# Patient Record
Sex: Female | Born: 1965 | Race: Black or African American | Hispanic: No | Marital: Single | Smoking: Current every day smoker
Health system: Southern US, Community
[De-identification: ages and names within clinical notes are randomized; demographics above are authoritative.]

## PROBLEM LIST (undated history)

## (undated) DIAGNOSIS — I10 Essential (primary) hypertension: Secondary | ICD-10-CM

## (undated) DIAGNOSIS — E119 Type 2 diabetes mellitus without complications: Secondary | ICD-10-CM

---

## 2006-11-16 ENCOUNTER — Emergency Department (HOSPITAL_COMMUNITY): Admission: EM | Admit: 2006-11-16 | Discharge: 2006-11-16 | Payer: Self-pay | Admitting: Family Medicine

## 2007-09-24 ENCOUNTER — Emergency Department (HOSPITAL_COMMUNITY): Admission: EM | Admit: 2007-09-24 | Discharge: 2007-09-24 | Payer: Self-pay | Admitting: Emergency Medicine

## 2008-05-15 ENCOUNTER — Emergency Department (HOSPITAL_COMMUNITY): Admission: EM | Admit: 2008-05-15 | Discharge: 2008-05-15 | Payer: Self-pay | Admitting: Emergency Medicine

## 2008-09-27 ENCOUNTER — Encounter (INDEPENDENT_AMBULATORY_CARE_PROVIDER_SITE_OTHER): Payer: Self-pay | Admitting: Otolaryngology

## 2008-09-27 ENCOUNTER — Inpatient Hospital Stay (HOSPITAL_COMMUNITY): Admission: EM | Admit: 2008-09-27 | Discharge: 2008-09-30 | Payer: Self-pay | Admitting: Emergency Medicine

## 2009-01-20 ENCOUNTER — Emergency Department (HOSPITAL_COMMUNITY): Admission: EM | Admit: 2009-01-20 | Discharge: 2009-01-20 | Payer: Self-pay | Admitting: Emergency Medicine

## 2009-12-06 ENCOUNTER — Emergency Department (HOSPITAL_COMMUNITY): Admission: EM | Admit: 2009-12-06 | Discharge: 2009-12-06 | Payer: Self-pay | Admitting: Emergency Medicine

## 2010-04-30 LAB — CBC
HCT: 36 % (ref 36.0–46.0)
Hemoglobin: 11 g/dL — ABNORMAL LOW (ref 12.0–15.0)
MCV: 66.4 fL — ABNORMAL LOW (ref 78.0–100.0)
Platelets: 301 10*3/uL (ref 150–400)
Platelets: 326 10*3/uL (ref 150–400)
RBC: 5.42 MIL/uL — ABNORMAL HIGH (ref 3.87–5.11)
RDW: 23.6 % — ABNORMAL HIGH (ref 11.5–15.5)
WBC: 18.3 10*3/uL — ABNORMAL HIGH (ref 4.0–10.5)
WBC: 9.6 10*3/uL (ref 4.0–10.5)

## 2010-04-30 LAB — APTT: aPTT: 27 seconds (ref 24–37)

## 2010-04-30 LAB — POCT I-STAT, CHEM 8
BUN: 5 mg/dL — ABNORMAL LOW (ref 6–23)
Calcium, Ion: 1.04 mmol/L — ABNORMAL LOW (ref 1.12–1.32)
Chloride: 105 mEq/L (ref 96–112)
HCT: 40 % (ref 36.0–46.0)
Sodium: 137 mEq/L (ref 135–145)
TCO2: 23 mmol/L (ref 0–100)

## 2010-04-30 LAB — ANAEROBIC CULTURE

## 2010-04-30 LAB — PROTIME-INR: INR: 1 (ref 0.00–1.49)

## 2010-04-30 LAB — DIFFERENTIAL
Basophils Absolute: 0 10*3/uL (ref 0.0–0.1)
Eosinophils Absolute: 0 10*3/uL (ref 0.0–0.7)
Eosinophils Relative: 0 % (ref 0–5)
Eosinophils Relative: 2 % (ref 0–5)
Lymphocytes Relative: 23 % (ref 12–46)
Lymphocytes Relative: 9 % — ABNORMAL LOW (ref 12–46)
Lymphs Abs: 1.6 10*3/uL (ref 0.7–4.0)
Monocytes Relative: 4 % (ref 3–12)
Monocytes Relative: 7 % (ref 3–12)
Neutrophils Relative %: 68 % (ref 43–77)

## 2010-04-30 LAB — RAPID STREP SCREEN (MED CTR MEBANE ONLY): Streptococcus, Group A Screen (Direct): NEGATIVE

## 2010-04-30 LAB — CULTURE, ROUTINE-ABSCESS

## 2010-05-05 LAB — DIFFERENTIAL
Basophils Relative: 0 % (ref 0–1)
Lymphocytes Relative: 13 % (ref 12–46)
Lymphs Abs: 1.5 10*3/uL (ref 0.7–4.0)
Monocytes Relative: 4 % (ref 3–12)
Neutro Abs: 9.4 10*3/uL — ABNORMAL HIGH (ref 1.7–7.7)
Neutrophils Relative %: 82 % — ABNORMAL HIGH (ref 43–77)

## 2010-05-05 LAB — URINALYSIS, ROUTINE W REFLEX MICROSCOPIC
Specific Gravity, Urine: 1.018 (ref 1.005–1.030)
Urobilinogen, UA: 0.2 mg/dL (ref 0.0–1.0)
pH: 5.5 (ref 5.0–8.0)

## 2010-05-05 LAB — POCT I-STAT, CHEM 8
Calcium, Ion: 1.15 mmol/L (ref 1.12–1.32)
Chloride: 106 mEq/L (ref 96–112)
HCT: 32 % — ABNORMAL LOW (ref 36.0–46.0)
TCO2: 27 mmol/L (ref 0–100)

## 2010-05-05 LAB — CBC
HCT: 31.3 % — ABNORMAL LOW (ref 36.0–46.0)
MCHC: 32.3 g/dL (ref 30.0–36.0)
Platelets: 252 10*3/uL (ref 150–400)
RBC: 3.85 MIL/uL — ABNORMAL LOW (ref 3.87–5.11)
WBC: 11.5 10*3/uL — ABNORMAL HIGH (ref 4.0–10.5)
WBC: 14.4 10*3/uL — ABNORMAL HIGH (ref 4.0–10.5)

## 2010-05-05 LAB — URINE MICROSCOPIC-ADD ON

## 2010-05-05 LAB — POCT PREGNANCY, URINE

## 2010-06-17 ENCOUNTER — Emergency Department (HOSPITAL_COMMUNITY)
Admission: EM | Admit: 2010-06-17 | Discharge: 2010-06-17 | Disposition: A | Discharge status: Home / Self Care | Payer: Medicaid Other | Attending: Emergency Medicine | Admitting: Emergency Medicine

## 2010-06-17 DIAGNOSIS — L2989 Other pruritus: Secondary | ICD-10-CM | POA: Insufficient documentation

## 2010-06-17 DIAGNOSIS — I10 Essential (primary) hypertension: Secondary | ICD-10-CM | POA: Insufficient documentation

## 2010-06-17 DIAGNOSIS — R21 Rash and other nonspecific skin eruption: Secondary | ICD-10-CM | POA: Insufficient documentation

## 2010-06-17 DIAGNOSIS — Z79899 Other long term (current) drug therapy: Secondary | ICD-10-CM | POA: Insufficient documentation

## 2010-06-17 DIAGNOSIS — L298 Other pruritus: Secondary | ICD-10-CM | POA: Insufficient documentation

## 2010-06-17 DIAGNOSIS — Z7982 Long term (current) use of aspirin: Secondary | ICD-10-CM | POA: Insufficient documentation

## 2010-08-17 ENCOUNTER — Emergency Department (HOSPITAL_COMMUNITY): Payer: Medicaid Other

## 2010-08-17 ENCOUNTER — Ambulatory Visit (HOSPITAL_COMMUNITY)
Admission: EM | Admit: 2010-08-17 | Discharge: 2010-08-18 | Disposition: A | Discharge status: Home / Self Care | Payer: Medicaid Other | Attending: Cardiology | Admitting: Cardiology

## 2010-08-17 DIAGNOSIS — I2510 Atherosclerotic heart disease of native coronary artery without angina pectoris: Secondary | ICD-10-CM | POA: Insufficient documentation

## 2010-08-17 DIAGNOSIS — Z01812 Encounter for preprocedural laboratory examination: Secondary | ICD-10-CM | POA: Insufficient documentation

## 2010-08-17 DIAGNOSIS — Z01818 Encounter for other preprocedural examination: Secondary | ICD-10-CM | POA: Insufficient documentation

## 2010-08-17 DIAGNOSIS — I10 Essential (primary) hypertension: Secondary | ICD-10-CM | POA: Insufficient documentation

## 2010-08-17 DIAGNOSIS — R079 Chest pain, unspecified: Principal | ICD-10-CM | POA: Insufficient documentation

## 2010-08-17 LAB — BASIC METABOLIC PANEL
BUN: 10 mg/dL (ref 6–23)
CO2: 23 mEq/L (ref 19–32)
Chloride: 107 mEq/L (ref 96–112)
Creatinine, Ser: 0.6 mg/dL (ref 0.50–1.10)
GFR calc Af Amer: >60 mL/min (ref >60)
Potassium: 3.8 mEq/L (ref 3.5–5.1)

## 2010-08-17 LAB — LIPID PANEL
HDL: 42 mg/dL (ref >39)
LDL Cholesterol: 107 mg/dL — ABNORMAL HIGH (ref 0–99)
Total CHOL/HDL Ratio: 3.9 RATIO

## 2010-08-17 LAB — CBC
HCT: 35.1 % — ABNORMAL LOW (ref 36.0–46.0)
MCV: 71.6 fL — ABNORMAL LOW (ref 78.0–100.0)
RBC: 4.9 MIL/uL (ref 3.87–5.11)
WBC: 14.1 10*3/uL — ABNORMAL HIGH (ref 4.0–10.5)

## 2010-08-17 LAB — CK TOTAL AND CKMB (NOT AT ARMC)
Relative Index: 1.2 (ref 0.0–2.5)
Relative Index: 1.3 (ref 0.0–2.5)
Total CK: 126 U/L (ref 7–177)

## 2010-08-17 LAB — DIFFERENTIAL
Lymphocytes Relative: 33 % (ref 12–46)
Lymphs Abs: 4.7 10*3/uL — ABNORMAL HIGH (ref 0.7–4.0)
Neutrophils Relative %: 60 % (ref 43–77)

## 2010-08-17 LAB — APTT: aPTT: 31 seconds (ref 24–37)

## 2010-08-17 LAB — TROPONIN I
Troponin I: <0.3 ng/mL (ref <0.30)
Troponin I: <0.3 ng/mL (ref <0.30)

## 2010-08-17 LAB — POCT PREGNANCY, URINE: Preg Test, Ur: NEGATIVE

## 2010-08-17 MED ORDER — IOHEXOL 300 MG/ML  SOLN
100.0000 mL | Freq: Once | INTRAMUSCULAR | Status: AC | PRN
  Administered 2010-08-17: 100 mL via INTRAVENOUS

## 2010-08-18 LAB — CBC
Platelets: 317 10*3/uL (ref 150–400)
RBC: 4.78 MIL/uL (ref 3.87–5.11)
RDW: 19.7 % — ABNORMAL HIGH (ref 11.5–15.5)
WBC: 12.3 10*3/uL — ABNORMAL HIGH (ref 4.0–10.5)

## 2010-08-18 LAB — COMPREHENSIVE METABOLIC PANEL
ALT: 12 U/L (ref 0–35)
AST: 16 U/L (ref 0–37)
Albumin: 3.2 g/dL — ABNORMAL LOW (ref 3.5–5.2)
Alkaline Phosphatase: 91 U/L (ref 39–117)
CO2: 24 mEq/L (ref 19–32)
Chloride: 105 mEq/L (ref 96–112)
GFR calc non Af Amer: >60 mL/min (ref >60)
Potassium: 3.8 mEq/L (ref 3.5–5.1)
Sodium: 139 mEq/L (ref 135–145)
Total Bilirubin: 0.1 mg/dL — ABNORMAL LOW (ref 0.3–1.2)

## 2010-08-18 NOTE — Cardiovascular Report (Signed)
NAMESHERIDYN, Lynn Compton NO.:  [id number]  MEDICAL RECORD NO.:  [id number]  LOCATION:  6531                         FACILITY:  [facility]  PHYSICIAN:  Verne Carrow, MDDATE OF BIRTH:  December 21, 1965  DATE OF PROCEDURE:  08/17/2010 DATE OF DISCHARGE:                           CARDIAC CATHETERIZATION   PRIMARY CARDIOLOGIST:  Dr. Peter [place].  PROCEDURES PERFORMED: 1. Left heart catheterization 2. Selective coronary angiography. 3. Left ventricular angiogram.  OPERATOR:  Verne Carrow, MD  INDICATIONS:  This is a 45 year old African American female with severely uncontrolled hypertension who presented to the emergency department with hypertensive urgency and complaints of chest pain. Stress echocardiogram showed possible ischemia in the anterior wall as well as a hypertensive blood pressure response to exercise.  The patient had a systolic blood pressure of 240 with exercise.  Diagnostic catheterization was arranged by Dr. Peter [place].  DETAILS OF PROCEDURE:  The patient was brought to the main cardiac catheterization laboratory after signing informed consent for the procedure.  Initially an Freida Busman test was performed on the right wrist and was positive.  We then prepped the right wrist in a sterile fashion. Lidocaine 1% was used for local anesthesia.  After multiple attempts, we were unable to gain access into the right radial artery.  We then proceeded with obtaining access into the right femoral artery.  This was with a 5-French sheath.  Lidocaine 1% was used for local anesthesia.  We then performed a left heart catheterization with standard diagnostic catheters.  A pigtail catheter was used to perform a left ventricular angiogram.  The patient tolerated the procedure well.  There were no immediate complications.  The patient was taken to the recovery area in stable condition.  HEMODYNAMIC FINDINGS:  Central aortic pressure 189/86.  Left  ventricular pressure 192/10/18.  ANGIOGRAPHIC FINDINGS: 1. The left main coronary artery had no evidence of disease. 2. The left anterior descending was a large vessel that coursed to the     apex and gave off a large diagonal branch.  There was mild plaque     in the proximal and mid vessel.  The diagonal branch was large and     had no evidence of disease. 3. The circumflex artery gave off three obtuse marginal branches that     were all moderate in size without any disease. 4. The right coronary artery was a large dominant vessel with mild     luminal irregularities in the midportion. 5. Left ventricular angiogram was performed in the RAO projection and     showed low normal left ventricular systolic function with ejection     fraction of 50%.  IMPRESSION: 1. Mild nonobstructive coronary artery disease. 2. Low normal left ventricular systolic function. 3. Uncontrolled hypertension.  RECOMMENDATIONS:  At this time I would recommend medical management of the patient's mild nonobstructive coronary artery disease and tight control of her hypertension.  We will continue her beta-blocker and lisinopril.  She will be watched closely tonight.  Probable discharge tomorrow.  I would also start the patient on a statin medication.     Verne Carrow, MD     CM/MEDQ  D:  08/17/2010  T:  08/18/2010  Job:  [id number]  cc:   Peter M. [place], M.D.  Electronically Signed by Verne Carrow MD on 08/18/2010 01:48:12 PM

## 2010-08-18 NOTE — Discharge Summary (Addendum)
NAMETRENIYA, LOBB               ACCOUNT NO.:  [id number]  MEDICAL RECORD NO.:  [id number]  LOCATION:  6531                         FACILITY:  [facility]  PHYSICIAN:  Verne Carrow, MDDATE OF BIRTH:  December 27, 1965  DATE OF ADMISSION:  08/17/2010 DATE OF DISCHARGE:  08/18/2010                              DISCHARGE SUMMARY   DISCHARGE DIAGNOSES: 1. Chest pain.     a.     Ruled out for myocardial infarction.     b.     Cardiac catheterization on August 17, 2010, demonstrating mild      nonobstructive coronary artery disease, for medical therapy.     c.     Ejection fraction was 50% by catheterization on August 17, 2010. 2. Uncontrolled hypertension. 3. Anemia.  HOSPITAL COURSE:  Ms. Blackston is a 45 year old female with no prior cardiac history, who has a longstanding history of hypertension.  She presented to the ER with complaints of chest pain, localized with a substernal discomfort with no associated nausea, vomiting, shortness of breath, or diaphoresis.  Cardiac enzymes and EKG were normal.  CT angiogram of the chest was negative for PE.  She had a stress echocardiogram yesterday demonstrating poor exercise tolerance with marked hypertensive blood pressures as well as with evidence of anterior septal hypokinesis on stress imaging consistent with ischemia. Therefore, she was seen in consultation by Cardiology and remained in the hospital for further rule out.  Next set of cardiac enzymes were negative.  She was markedly hypertensive, so blood pressure medications were initiated in the form of lisinopril, hydrochlorothiazide, as well as beta-blockade.  She underwent cardiac catheterization later that day demonstrating mild nonobstructive CAD with low-normal LV function.  EF was 50%.  The patient was started on statin medication as part of medical therapy.  On day of discharge, she was feeling better.  Blood pressure still in 159/81.  Dr. Clifton James elected to increase  her medications with the addition of beta-blocker at discharge, as well as lisinopril/hydrochlorothiazide 40/25 mg p.o. daily.  Dr. Clifton James seen and examined her today and felt she is stable for discharge.  DISCHARGE LABORATORY DATA:  WBC 12.3, hemoglobin 11.5, hematocrit 34.1, platelet count 317. Sodium 139, potassium 3.8, chloride 105, CO2 of 24, glucose 108, BUN 9, creatinine 0.53.  LFTs were normal on August 18, 2010, with the exception of decreased total bilirubin 0.1 and albumin 3.2. Cardiac enzymes negative x3.  Total cholesterol 163, triglycerides 72, LDL 107.  STUDIES: 1. Cardiac catheterization on August 17, 2010, please see full report     for details for details. 2. CT angiogram of the chest on August 17, 2010, showed no pulmonary     embolism identified.  Cardiomegaly.  Mild areas of ground-glass     opacification, mosaic attenuation, likely secondary to atelectasis     or air trapping, mild pulmonary edema could have similar     appearance. 3. Chest x-ray on August 17, 2010, shows stable cardiomegaly without     acute process identified.  DISCHARGE MEDICATIONS: 1. Coreg 6.25 mg b.i.d. 2. Nitroglycerin sublingual 0.4 mg every 5 minutes as needed up to 3     doses for  chest pain. 3. Prilosec 80 mg nightly. 4. Lisinopril/hydrochlorothiazide 20/12.5 mg 2 tablets daily. 5. Aspirin 81 mg daily.  DISPOSITION:  Ms. Shimabukuro will be discharged in stable condition to home. She is to follow a low-sodium, heart-healthy diet, and call or return if she notices any pain, swelling, bleeding, or pus at cath site.  She will follow up at Dr. Elvis Coil office in 2-3 weeks and our office will call her with that appointment.  She is also instructed to seek an appointment with her primary care doctor within the week to discuss workup of her anemia that was noted this admission as well.  DURATION OF DISCHARGE ENCOUNTER:  Greater than 30 minutes including physician and PA time.     Ronie Spies,  P.A.C.   ______________________________ Verne Carrow, MD    DD/MEDQ  D:  08/18/2010  T:  08/18/2010  Job:  [id number]  cc:   Peter M. [place], M.D.  Electronically Signed by Verne Carrow MD on 08/18/2010 01:48:14 PM Electronically Signed by Ronie Spies  on 08/19/2010 06:47:50 AM MedRecNo: [id number] [facility], Account: [id number], DocSeq: [id number] Aspirin 81mg  is also part of her discharge medications. Electronically Signed by Ronie Spies  on 08/19/2010 06:47:46 AM

## 2010-08-20 NOTE — H&P (Signed)
Lynn Compton, Lynn Compton NO.:  [id number]  MEDICAL RECORD NO.:  [id number]  LOCATION:  6531                         FACILITY:  [facility]  PHYSICIAN:  Willman Cuny M. [place], M.D.  DATE OF BIRTH:  09/20/1965  DATE OF ADMISSION:  08/17/2010 DATE OF DISCHARGE:                             HISTORY & PHYSICAL   HISTORY OF PRESENT ILLNESS:  Ms. Preston is a 45 year old African American female who has a long history of hypertension.  She presented to the emergency department last night with complaint of chest pain.  The pain was first noted when she was carrying groceries last night.  It was described as a localized substernal chest pain.  She had no radiation. She denied any nausea, vomiting, shortness of breath, diaphoresis.  The pain continued until approximately 1 a.m. where she was uncomfortable enough that she presented to the emergency room.  She is currently pain free.  She does have cardiac risk factors including hypertension, family history of coronary artery disease, and tobacco use.  Her lipid status is unknown.  Evaluation in the emergency department included a cardiac enzymes and ECG which were normal.  CT of the chest was negative.  She had a stress echo earlier today which showed poor exercise tolerance with marked hypertensive blood pressure response.  There was evidence of anteroseptal hypokinesis on stress imaging consistent with ischemia.  PAST MEDICAL HISTORY:  Hypertension.  ALLERGIES:  No known allergies.  Surgeries include C-section and a lymph node excision in her left neck.  SOCIAL HISTORY:  She is unemployed.  She is married with 2 children. She smokes 5-6 cigarettes per day.  She drinks occasional alcohol on the weekends.  FAMILY HISTORY:  Mother died at age 37 with diabetes.  Father died at age 18, had a history of alcohol use and a heart attack.  One brother has diabetes, one brother has history of stroke.  REVIEW OF SYSTEMS:  She denies any  diarrhea.  She has had no history of kidney dysfunction.  She has had no swelling or orthopnea.  All other systems are reviewed and are negative.  She has no prior history of stroke or TIA.  PHYSICAL EXAMINATION:  GENERAL:  She is an obese black female in no apparent distress. VITAL SIGNS:  Blood pressure is 192/90, pulse is 70 and regular, she is afebrile. HEENT:  She is normocephalic, atraumatic.  Pupils are equal, round, and reactive to light and accommodation.  Extraocular movements are full. Oropharynx is clear. NECK:  Supple without JVD, adenopathy, thyromegaly, or bruits. LUNGS:  Clear. CARDIAC:  Regular rate and rhythm without gallop, murmur, or click. ABDOMEN:  Soft and nontender.  There are no masses or bruits. EXTREMITIES:  Without edema.  Pulses are 2+ and symmetric. SKIN:  Warm and dry. NEUROLOGIC:  She is alert and oriented x3.  Cranial nerves II through XII are intact.  She has no focal findings.  Gait is normal.  LABORATORY DATA:  ECG demonstrates normal sinus rhythm with a rate of 71 beats per minute and a normal ECG.  Urine pregnancy screen is negative. Chest x-ray shows cardiomegaly with no active disease.  Point-of-care cardiac enzymes are negative x2.  CT of the chest with contrast was negative for pulmonary embolus or other pathology other than mild bilateral hilar adenopathy.  White count is 14,100, hemoglobin 11.1, hematocrit 35.1, platelets 322,000.  Sodium is 141, potassium 3.8, chloride 107, CO2 of 23, BUN 10, creatinine 0.6, glucose of 117.  Stress echo showed poor exercise tolerance.  The patient was able to exercise only 5 minutes.  She did have an adequate heart rate response.  Her blood pressure response was hypertensive with a systolic rating up to 242.  She did have evidence of mid-to-distal anterior and septal hypokinesis with stress.  Plan on TURP.  IMPRESSION: 1. Chest pain, atypical, but with positive stress echocardiogram     suggesting  evidence of ischemia in the left anterior descending     artery territory. 2. Hypertension, poorly controlled. 3. Obesity. 4. Tobacco use. 5. Mild anemia.  PLAN:  We will check her lipid status.  We will resume antihypertensive therapy with lisinopril/hydrochlorothiazide and beta-blocker therapy. She will be given aspirin.  We will arrange for a diagnostic cardiac catheterization later today.  This procedure and its potential risks were explained in detail to the patient.          ______________________________ Tyjon Bowen M. [place], M.D.     PMJ/MEDQ  D:  08/17/2010  T:  08/18/2010  Job:  [id number]  Electronically Signed by Daijanae Rafalski [place] M.D. on 08/20/2010 08:50:54 AM

## 2010-10-26 ENCOUNTER — Other Ambulatory Visit: Payer: Self-pay | Admitting: Internal Medicine

## 2010-10-26 DIAGNOSIS — Z1231 Encounter for screening mammogram for malignant neoplasm of breast: Secondary | ICD-10-CM

## 2010-11-03 LAB — POCT PREGNANCY, URINE: Operator id: 239701

## 2010-11-03 LAB — POCT URINALYSIS DIP (DEVICE)
Bilirubin Urine: NEGATIVE
Hgb urine dipstick: NEGATIVE
Ketones, ur: NEGATIVE
Specific Gravity, Urine: 1.02
pH: 5.5

## 2010-11-10 ENCOUNTER — Ambulatory Visit
Admission: RE | Admit: 2010-11-10 | Discharge: 2010-11-10 | Disposition: A | Discharge status: Home / Self Care | Payer: Medicaid Other | Source: Ambulatory Visit | Attending: Internal Medicine | Admitting: Internal Medicine

## 2010-11-10 DIAGNOSIS — Z1231 Encounter for screening mammogram for malignant neoplasm of breast: Secondary | ICD-10-CM

## 2010-11-11 ENCOUNTER — Other Ambulatory Visit: Payer: Self-pay | Admitting: Internal Medicine

## 2010-11-11 DIAGNOSIS — R928 Other abnormal and inconclusive findings on diagnostic imaging of breast: Secondary | ICD-10-CM

## 2010-11-25 ENCOUNTER — Ambulatory Visit
Admission: RE | Admit: 2010-11-25 | Discharge: 2010-11-25 | Disposition: A | Discharge status: Home / Self Care | Payer: Medicaid Other | Source: Ambulatory Visit | Attending: Internal Medicine | Admitting: Internal Medicine

## 2010-11-25 DIAGNOSIS — R928 Other abnormal and inconclusive findings on diagnostic imaging of breast: Secondary | ICD-10-CM

## 2012-07-03 ENCOUNTER — Encounter (HOSPITAL_COMMUNITY): Payer: Self-pay | Admitting: Emergency Medicine

## 2012-07-03 ENCOUNTER — Emergency Department (HOSPITAL_COMMUNITY): Payer: No Typology Code available for payment source

## 2012-07-03 ENCOUNTER — Emergency Department (HOSPITAL_COMMUNITY)
Admission: EM | Admit: 2012-07-03 | Discharge: 2012-07-03 | Disposition: A | Discharge status: Home / Self Care | Payer: No Typology Code available for payment source | Attending: Emergency Medicine | Admitting: Emergency Medicine

## 2012-07-03 DIAGNOSIS — M65849 Other synovitis and tenosynovitis, unspecified hand: Secondary | ICD-10-CM | POA: Insufficient documentation

## 2012-07-03 DIAGNOSIS — M65839 Other synovitis and tenosynovitis, unspecified forearm: Secondary | ICD-10-CM | POA: Insufficient documentation

## 2012-07-03 DIAGNOSIS — F17200 Nicotine dependence, unspecified, uncomplicated: Secondary | ICD-10-CM | POA: Insufficient documentation

## 2012-07-03 DIAGNOSIS — Z7982 Long term (current) use of aspirin: Secondary | ICD-10-CM | POA: Insufficient documentation

## 2012-07-03 DIAGNOSIS — I10 Essential (primary) hypertension: Secondary | ICD-10-CM | POA: Insufficient documentation

## 2012-07-03 DIAGNOSIS — Z79899 Other long term (current) drug therapy: Secondary | ICD-10-CM | POA: Insufficient documentation

## 2012-07-03 DIAGNOSIS — M779 Enthesopathy, unspecified: Secondary | ICD-10-CM

## 2012-07-03 HISTORY — DX: Essential (primary) hypertension: I10

## 2012-07-03 NOTE — ED Notes (Signed)
Applied lt. Splint .  Verbal instructions given to pt. On care of splint. Pt. Verbalized understanding

## 2012-07-03 NOTE — ED Provider Notes (Signed)
History     CSN: [id number]  Arrival date & time 07/03/12  0830   First MD Initiated Contact with Patient 07/03/12 [phone]      Chief Complaint  Patient presents with  . Wrist Pain    (Consider location/radiation/quality/duration/timing/severity/associated sxs/prior treatment) HPI Comments: Patient is a 47 year old female who presents for swelling of her left upper extremity, slightly proximal to her left wrist x3 days. Patient states that she noticed the swelling 3 days ago and has been unchanged since onset. Patient denies any aggravating or alleviating factors and denies injury or trauma to her left arm or wrist. Patient denies any pain, decreased range of motion, numbness, redness or pallor. Patient also denies fever. 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  Patient is a 47 y.o. female presenting with wrist pain. The history is provided by the patient. No language interpreter was used.  Wrist Pain This is a new problem. Episode onset: 3 days. The problem occurs constantly. The problem has been unchanged. Pertinent negatives include no arthralgias, chills, fever, myalgias, numbness, rash or weakness. Nothing aggravates the symptoms. She has tried nothing for the symptoms.    Past Medical History  Diagnosis Date  . Hypertension     History reviewed. No pertinent past surgical history.  History reviewed. No pertinent family history.  History  Substance Use Topics  . Smoking status: Current Every Day Smoker  . Smokeless tobacco: Not on file  . Alcohol Use: Yes     Comment: occ    OB History   Grav Para Term Preterm Abortions TAB SAB Ect Mult Living                  Review of Systems  Constitutional: Negative for fever and chills.  Respiratory: Negative for shortness of breath.   Musculoskeletal: Negative for myalgias and arthralgias.       Swelling of LUE proximal to L wrist  Skin: Negative for color change, pallor, rash and wound.   Neurological: Negative for weakness and numbness.  All other systems reviewed and are negative.    Allergies  Review of patient's allergies indicates no known allergies.  Home Medications   Current Outpatient Rx  Name  Route  Sig  Dispense  Refill  . acetaminophen-codeine (TYLENOL #3) 300-30 MG per tablet   Oral   Take 1 tablet by mouth every 6 (six) hours as needed for pain.         Marland Kitchen aspirin EC 81 MG tablet   Oral   Take 81 mg by mouth daily.         . carvedilol (COREG) 12.5 MG tablet   Oral   Take 12.5 mg by mouth daily.         Marland Kitchen lisinopril-hydrochlorothiazide (PRINZIDE,ZESTORETIC) 20-12.5 MG per tablet   Oral   Take 2 tablets by mouth daily.           BP 139/70  Pulse 73  Temp(Src) 98 F (36.7 C) (Oral)  Resp 18  SpO2 97%  LMP 06/21/2012  Physical Exam  Nursing note and vitals reviewed. Constitutional: She is oriented to person, place, and time. She appears well-developed and well-nourished. No distress.  HENT:  Head: Normocephalic and atraumatic.  Eyes: Conjunctivae and EOM are normal. No scleral icterus.  Neck: Normal range of motion.  Cardiovascular: Normal rate, regular rhythm and intact distal pulses.   Distal radial pulses 2+ bilaterally. Capillary refill normal.  Pulmonary/Chest: Effort normal. No  respiratory distress.  Musculoskeletal: Normal range of motion.  Swelling on dorsal aspect of LUE, approximately 4 cm in diameter proximal to left wrist; no erythema, rashes, or TTP. Patient has equal grip strength b/l and normal ROM of LUE and L wrist joint. No sensory or motor deficits appreciated.  Neurological: She is alert and oriented to person, place, and time. She exhibits normal muscle tone.  Skin: Skin is warm and dry. No rash noted. She is not diaphoretic. No erythema. No pallor.  Psychiatric: She has a normal mood and affect. Her behavior is normal.    ED Course  Procedures (including critical care time)  Labs Reviewed - No data to  display Dg Wrist Complete Left  07/03/2012   *RADIOLOGY REPORT*  Clinical Data: Distal forearm swelling without evidence of injury  LEFT WRIST - COMPLETE 3+ VIEW  Comparison: None.  Findings: Frontal, oblique, lateral, and ulnar deviation scaphoid images were obtained.  No fracture or dislocation.  There is prominence of the pronator quadratus fat pad, a finding consistent with soft tissue edema, possibly with effusion in this area.  There is no appreciable arthropathic change.  No erosions.  IMPRESSION: Prominence of the pronator quadratus fat pad of uncertain etiology. No fracture or dislocation.  No appreciable arthropathy.   Original Report Authenticated By: Bretta Bang, M.D.    1. Tendonitis      MDM  Uncomplicated soft tissue swelling of left arm proximal to L wrist, most consistent with tendonitis 2/2 overuse - likely work related. Xray significant for prominence of pronator quadratus fat pad without evidence of fracture. Patient neurovascularly intact. No redness, TTP, or heat to touch to suspect infection. No limited ROM on physical exam. No swelling of L hand or pitting edema to suspect vascular insufficiency. Patient put in wrist brace in ED and given RICE instruction. Appropriate for d/c with PCP follow up to ensure resolution of symptoms. Indications for ED return discussed. Patient states comfort and understanding with this discharge plan with no unaddressed concerns.        Antony Madura, PA-C 07/03/12 [phone]

## 2012-07-03 NOTE — ED Notes (Signed)
Pt c/o left wrist pain x 3 days with some swelling; pt denies obvious injury

## 2012-07-03 NOTE — ED Provider Notes (Signed)
Medical screening examination/treatment/procedure(s) were performed by non-physician practitioner and as supervising physician I was immediately available for consultation/collaboration.   Henessy Rohrer III, MD 07/03/12 [id number] 

## 2013-08-21 ENCOUNTER — Other Ambulatory Visit: Payer: Self-pay | Admitting: Internal Medicine

## 2013-08-21 DIAGNOSIS — N63 Unspecified lump in unspecified breast: Secondary | ICD-10-CM

## 2014-02-19 ENCOUNTER — Encounter (HOSPITAL_COMMUNITY): Payer: Self-pay

## 2014-02-19 ENCOUNTER — Emergency Department (HOSPITAL_COMMUNITY)
Admission: EM | Admit: 2014-02-19 | Discharge: 2014-02-19 | Disposition: A | Discharge status: Home / Self Care | Payer: No Typology Code available for payment source | Attending: Emergency Medicine | Admitting: Emergency Medicine

## 2014-02-19 DIAGNOSIS — Z7982 Long term (current) use of aspirin: Secondary | ICD-10-CM | POA: Insufficient documentation

## 2014-02-19 DIAGNOSIS — I10 Essential (primary) hypertension: Secondary | ICD-10-CM | POA: Insufficient documentation

## 2014-02-19 DIAGNOSIS — Z79899 Other long term (current) drug therapy: Secondary | ICD-10-CM | POA: Insufficient documentation

## 2014-02-19 DIAGNOSIS — E860 Dehydration: Secondary | ICD-10-CM | POA: Insufficient documentation

## 2014-02-19 DIAGNOSIS — Z720 Tobacco use: Secondary | ICD-10-CM | POA: Insufficient documentation

## 2014-02-19 LAB — I-STAT CHEM 8, ED
BUN: 8 mg/dL (ref 6–23)
Calcium, Ion: 1.12 mmol/L (ref 1.12–1.23)
Chloride: 98 mmol/L (ref 96–112)
Creatinine, Ser: 0.7 mg/dL (ref 0.50–1.10)
Glucose, Bld: 192 mg/dL — ABNORMAL HIGH (ref 70–99)
HCT: 39 % (ref 36.0–46.0)
HEMOGLOBIN: 13.3 g/dL (ref 12.0–15.0)
Potassium: 3.1 mmol/L — ABNORMAL LOW (ref 3.5–5.1)
SODIUM: 141 mmol/L (ref 135–145)
TCO2: 28 mmol/L (ref 0–100)

## 2014-02-19 LAB — BASIC METABOLIC PANEL
ANION GAP: 10 (ref 5–15)
BUN: 7 mg/dL (ref 6–23)
CO2: 28 mmol/L (ref 19–32)
Calcium: 9 mg/dL (ref 8.4–10.5)
Chloride: 101 mmol/L (ref 96–112)
Creatinine, Ser: 0.72 mg/dL (ref 0.50–1.10)
GFR calc non Af Amer: >90 mL/min (ref >90)
Glucose, Bld: 191 mg/dL — ABNORMAL HIGH (ref 70–99)
POTASSIUM: 3.2 mmol/L — AB (ref 3.5–5.1)
Sodium: 139 mmol/L (ref 135–145)

## 2014-02-19 LAB — CBG MONITORING, ED: GLUCOSE-CAPILLARY: 155 mg/dL — AB (ref 70–99)

## 2014-02-19 LAB — CBC
HEMATOCRIT: 34.2 % — AB (ref 36.0–46.0)
Hemoglobin: 10.7 g/dL — ABNORMAL LOW (ref 12.0–15.0)
MCH: 22.8 pg — ABNORMAL LOW (ref 26.0–34.0)
MCHC: 31.3 g/dL (ref 30.0–36.0)
MCV: 72.8 fL — AB (ref 78.0–100.0)
PLATELETS: 334 10*3/uL (ref 150–400)
RBC: 4.7 MIL/uL (ref 3.87–5.11)
RDW: 21.5 % — AB (ref 11.5–15.5)
WBC: 11.6 10*3/uL — AB (ref 4.0–10.5)

## 2014-02-19 LAB — I-STAT CG4 LACTIC ACID, ED
LACTIC ACID, VENOUS: 2.05 mmol/L — AB (ref 0.5–2.0)
Lactic Acid, Venous: 2.57 mmol/L (ref 0.5–2.0)

## 2014-02-19 MED ORDER — SODIUM CHLORIDE 0.9 % IV BOLUS (SEPSIS)
1000.0000 mL | Freq: Once | INTRAVENOUS | Status: AC
  Administered 2014-02-19: 1000 mL via INTRAVENOUS

## 2014-02-19 MED ORDER — SODIUM CHLORIDE 0.9 % IV SOLN
Freq: Once | INTRAVENOUS | Status: AC
  Administered 2014-02-19: 21:00:00 via INTRAVENOUS

## 2014-02-19 NOTE — ED Provider Notes (Signed)
CSN: [id number]     Arrival date & time 02/19/14  1807 History   First MD Initiated Contact with Patient 02/19/14 1900     Chief Complaint  Patient presents with  . Weakness  . Hypotension     (Consider location/radiation/quality/duration/timing/severity/associated sxs/prior Treatment) HPI  49 year old female with past history of hypertension presents to ED after having near syncopal episode at work. 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 She states she sat down and asked one of her coworkers to check her blood pressure which was 80s systolic. Symptoms resolved after approximately 15 minutes of rest. It was at that point that EMS was called and she was taken to the ED for further evaluation. Patient reports she has not been eating and drinking very well the last few days. Says she is working all the time and gets very little sleep as well. She is always fatigued. Patient denies having any chest pain, shortness of breath, nausea, vomiting, vision changes, headache, numbness, tingling. Patient reports having similar symptoms in the past but she is unsure as to why. She denies having any family history of cardiac problems. She is not a diabetic. No recent illness.   Past Medical History  Diagnosis Date  . Hypertension    History reviewed. No pertinent past surgical history. History reviewed. No pertinent family history. History  Substance Use Topics  . Smoking status: Current Every Day Smoker  . Smokeless tobacco: Not on file  . Alcohol Use: Yes     Comment: occ   OB History    No data available     Review of Systems  Constitutional: Positive for fatigue. Negative for fever and chills.  HENT: Negative for congestion, rhinorrhea and sore throat.   Eyes: Negative for visual disturbance.  Respiratory: Negative for cough and shortness of breath.   Cardiovascular: Negative for chest pain, palpitations and  leg swelling.  Gastrointestinal: Negative for nausea, vomiting, abdominal pain, diarrhea and constipation.  Genitourinary: Negative for dysuria, hematuria, vaginal bleeding and vaginal discharge.  Musculoskeletal: Negative for back pain and neck pain.  Skin: Negative for rash.  Neurological: Positive for syncope (near) and light-headedness. Negative for weakness and headaches.  All other systems reviewed and are negative.     Allergies  Review of patient's allergies indicates no known allergies.  Home Medications   Prior to Admission medications   Medication Sig Start Date End Date Taking? Authorizing Provider  aspirin EC 81 MG tablet Take 81 mg by mouth daily.   Yes Historical Provider, MD  carvedilol (COREG) 12.5 MG tablet Take 12.5 mg by mouth daily.   Yes Historical Provider, MD  lisinopril-hydrochlorothiazide (PRINZIDE,ZESTORETIC) 20-12.5 MG per tablet Take 2 tablets by mouth daily.   Yes Historical Provider, MD   BP 84/55 mmHg  Pulse 58  Temp(Src) 98.1 F (36.7 C) (Oral)  Resp 22  SpO2 99% Physical Exam  Constitutional: She is oriented to person, place, and time. She appears well-developed and well-nourished. No distress.  HENT:  Head: Normocephalic and atraumatic.  Eyes: Conjunctivae are normal.  Neck: Normal range of motion.  Cardiovascular: Normal rate, regular rhythm, normal heart sounds and intact distal pulses.   No murmur heard. Pulmonary/Chest: Effort normal and breath sounds normal. No respiratory distress. She has no wheezes. She has no rales. She exhibits no tenderness.  Abdominal: Soft. Bowel sounds are normal. She exhibits no distension.  Musculoskeletal: Normal range of motion.  Neurological: She is alert  and oriented to person, place, and time. No cranial nerve deficit.  HDS, AAOx4. PERRL, EOMI, TML, face sym. CN 2-12 grossly intact. 5/5 sym, no drift, SILT, normal coordination.    Skin: Skin is warm and dry.  Psychiatric: She has a normal mood and  affect.  Nursing note and vitals reviewed.   ED Course  Procedures (including critical care time) Labs Review Labs Reviewed  CBC - Abnormal; Notable for the following:    WBC 11.6 (*)    Hemoglobin 10.7 (*)    HCT 34.2 (*)    MCV 72.8 (*)    MCH 22.8 (*)    RDW 21.5 (*)    All other components within normal limits  BASIC METABOLIC PANEL - Abnormal; Notable for the following:    Potassium 3.2 (*)    Glucose, Bld 191 (*)    All other components within normal limits  CBG MONITORING, ED - Abnormal; Notable for the following:    Glucose-Capillary 155 (*)    All other components within normal limits  I-STAT CHEM 8, ED - Abnormal; Notable for the following:    Potassium 3.1 (*)    Glucose, Bld 192 (*)    All other components within normal limits  I-STAT CG4 LACTIC ACID, ED - Abnormal; Notable for the following:    Lactic Acid, Venous 2.57 (*)    All other components within normal limits  I-STAT CG4 LACTIC ACID, ED    Imaging Review No results found.   EKG Interpretation   Date/Time:  Wednesday February 19 2014 18:19:29 EST Ventricular Rate:  58 PR Interval:  158 QRS Duration: 88 QT Interval:  402 QTC Calculation: 394 R Axis:   63 Text Interpretation:  Sinus bradycardia Nonspecific T wave abnormality  Abnormal ECG Confirmed by DOCHERTY  MD, MEGAN ([id number]) on 02/19/2014 7:37:32  PM      MDM   Final diagnoses:  None    H&P as above. On arrival BP 80s sys. No tachycardia. Cap refill 3-5 sec. Pt o/w well appearing, and NAD. SHe is asymptomatic in ED. Lactate 2.57. I suspect pt dehydration is culprit for hypotension. 2L NS followed by infusion started while screening labs and EKG ordered. BP responds well.  EKG shows sinus brady and o/w NSTWA. Screening labs WNL. Repeat lactate improved. Pt remained asymptomatic in ED and is ambulating well in ED without assistance. VSS. Pt stable for d/c. Advised inc PO intake and rest. Work note given.  Clinical Impression: 1.  Dehydration     Disposition: Discharge  Condition: Good  I have discussed the results, Dx and Tx plan with the pt(& family if present). He/she/they expressed understanding and agree(s) with the plan. Discharge instructions discussed at great length. Strict return precautions discussed and pt &/or family have verbalized understanding of the instructions. No further questions at time of discharge.    Discharge Medication List as of 02/19/2014 10:06 PM      Follow Up: Dorrene German, MD [address] [place] [address] [phone]  Schedule an appointment as soon as possible for a visit in 2 days   [facility] EMERGENCY DEPARTMENT [address] [id number] mc [place] [address] [phone]  If symptoms worsen   Pt seen in conjunction with Dr. Donnamarie Rossetti, DO [facility] Emergency Medicine Resident - PGY-2    Ames Dura, MD 02/20/14 0225  Toy Cookey, MD 02/20/14 1152

## 2014-02-19 NOTE — ED Notes (Signed)
Pt. Left with all belongings 

## 2014-02-19 NOTE — ED Notes (Signed)
Pt reports being at work and all of sudden getting real dizzy and weak.  BP checked by coworkers and was reported to be in the 80's systolic.  Pt sts she has a hx of hypertension but not hypotension.  Pt still reporting weakness in triage.

## 2014-02-19 NOTE — Discharge Instructions (Signed)
Dehydration, Adult Dehydration is when you lose more fluids from the body than you take in. Vital organs like the kidneys, brain, and heart cannot function without a proper amount of fluids and salt. Any loss of fluids from the body can cause dehydration.  CAUSES   Vomiting.  Diarrhea.  Excessive sweating.  Excessive urine output.  Fever. SYMPTOMS  Mild dehydration  Thirst.  Dry lips.  Slightly dry mouth. Moderate dehydration  Very dry mouth.  Sunken eyes.  Skin does not bounce back quickly when lightly pinched and released.  Dark urine and decreased urine production.  Decreased tear production.  Headache. Severe dehydration  Very dry mouth.  Extreme thirst.  Rapid, weak pulse (more than 100 beats per minute at rest).  Cold hands and feet.  Not able to sweat in spite of heat and temperature.  Rapid breathing.  Blue lips.  Confusion and lethargy.  Difficulty being awakened.  Minimal urine production.  No tears. DIAGNOSIS  Your caregiver will diagnose dehydration based on your symptoms and your exam. Blood and urine tests will help confirm the diagnosis. The diagnostic evaluation should also identify the cause of dehydration. TREATMENT  Treatment of mild or moderate dehydration can often be done at home by increasing the amount of fluids that you drink. It is best to drink small amounts of fluid more often. Drinking too much at one time can make vomiting worse. Refer to the home care instructions below. Severe dehydration needs to be treated at the hospital where you will probably be given intravenous (IV) fluids that contain water and electrolytes. HOME CARE INSTRUCTIONS   Ask your caregiver about specific rehydration instructions.  Drink enough fluids to keep your urine clear or pale yellow.  Drink small amounts frequently if you have nausea and vomiting.  Eat as you normally do.  Avoid:  Foods or drinks high in sugar.  Carbonated  drinks.  Juice.  Extremely hot or cold fluids.  Drinks with caffeine.  Fatty, greasy foods.  Alcohol.  Tobacco.  Overeating.  Gelatin desserts.  Wash your hands well to avoid spreading bacteria and viruses.  Only take over-the-counter or prescription medicines for pain, discomfort, or fever as directed by your caregiver.  Ask your caregiver if you should continue all prescribed and over-the-counter medicines.  Keep all follow-up appointments with your caregiver. SEEK MEDICAL CARE IF:  You have abdominal pain and it increases or stays in one area (localizes).  You have a rash, stiff neck, or severe headache.  You are irritable, sleepy, or difficult to awaken.  You are weak, dizzy, or extremely thirsty. SEEK IMMEDIATE MEDICAL CARE IF:   You are unable to keep fluids down or you get worse despite treatment.  You have frequent episodes of vomiting or diarrhea.  You have blood or green matter (bile) in your vomit.  You have blood in your stool or your stool looks black and tarry.  You have not urinated in 6 to 8 hours, or you have only urinated a small amount of very dark urine.  You have a fever.  You faint. MAKE SURE YOU:   Understand these instructions.  Will watch your condition.  Will get help right away if you are not doing well or get worse. Document Released: 01/10/2005 Document Revised: 04/04/2011 Document Reviewed: 08/30/2010 ExitCare Patient Information 2015 [employer]. This information is not intended to replace advice given to you by your health care provider. Make sure you discuss any questions you have with your health care   provider.  

## 2014-03-22 ENCOUNTER — Emergency Department (HOSPITAL_COMMUNITY)
Admission: EM | Admit: 2014-03-22 | Discharge: 2014-03-22 | Disposition: A | Discharge status: Home / Self Care | Payer: No Typology Code available for payment source | Attending: Emergency Medicine | Admitting: Emergency Medicine

## 2014-03-22 ENCOUNTER — Encounter (HOSPITAL_COMMUNITY): Payer: Self-pay | Admitting: Emergency Medicine

## 2014-03-22 DIAGNOSIS — M2550 Pain in unspecified joint: Secondary | ICD-10-CM | POA: Insufficient documentation

## 2014-03-22 DIAGNOSIS — Z720 Tobacco use: Secondary | ICD-10-CM | POA: Insufficient documentation

## 2014-03-22 DIAGNOSIS — Z79899 Other long term (current) drug therapy: Secondary | ICD-10-CM | POA: Insufficient documentation

## 2014-03-22 DIAGNOSIS — R202 Paresthesia of skin: Secondary | ICD-10-CM | POA: Insufficient documentation

## 2014-03-22 DIAGNOSIS — Z7982 Long term (current) use of aspirin: Secondary | ICD-10-CM | POA: Insufficient documentation

## 2014-03-22 DIAGNOSIS — I10 Essential (primary) hypertension: Secondary | ICD-10-CM | POA: Insufficient documentation

## 2014-03-22 NOTE — ED Notes (Signed)
Pt. Stated, I was told by [facility] that I have tendonitis in my fingers for 2 years.  Its tingling and its effecting my sleep

## 2014-03-22 NOTE — Discharge Instructions (Signed)
Take ibuprofen, 600 mg every 6 hours. Follow-up with your primary care physician to discuss the tingling sensation in your fingers.  Paresthesia Paresthesia is an abnormal burning or prickling sensation. This sensation is generally felt in the hands, arms, legs, or feet. However, it may occur in any part of the body. It is usually not painful. The feeling may be described as:  Tingling or numbness.  "Pins and needles."  Skin crawling.  Buzzing.  Limbs "falling asleep."  Itching. Most people experience temporary (transient) paresthesia at some time in their lives. CAUSES  Paresthesia may occur when you breathe too quickly (hyperventilation). It can also occur without any apparent cause. Commonly, paresthesia occurs when pressure is placed on a nerve. The feeling quickly goes away once the pressure is removed. For some people, however, paresthesia is a long-lasting (chronic) condition caused by an underlying disorder. The underlying disorder may be:  A traumatic, direct injury to nerves. Examples include a:  Broken (fractured) neck.  Fractured skull.  A disorder affecting the brain and spinal cord (central nervous system). Examples include:  Transverse myelitis.  Encephalitis.  Transient ischemic attack.  Multiple sclerosis.  Stroke.  Tumor or blood vessel problems, such as an arteriovenous malformation pressing against the brain or spinal cord.  A condition that damages the peripheral nerves (peripheral neuropathy). Peripheral nerves are not part of the brain and spinal cord. These conditions include:  Diabetes.  Peripheral vascular disease.  Nerve entrapment syndromes, such as carpal tunnel syndrome.  Shingles.  Hypothyroidism.  Vitamin B12 deficiencies.  Alcoholism.  Heavy metal poisoning (lead, arsenic).  Rheumatoid arthritis.  Systemic lupus erythematosus. DIAGNOSIS  Your caregiver will attempt to find the underlying cause of your paresthesia. Your  caregiver may:  Take your medical history.  Perform a physical exam.  Order various lab tests.  Order imaging tests. TREATMENT  Treatment for paresthesia depends on the underlying cause. HOME CARE INSTRUCTIONS  Avoid drinking alcohol.  You may consider massage or acupuncture to help relieve your symptoms.  Keep all follow-up appointments as directed by your caregiver. SEEK IMMEDIATE MEDICAL CARE IF:   You feel weak.  You have trouble walking or moving.  You have problems with speech or vision.  You feel confused.  You cannot control your bladder or bowel movements.  You feel numbness after an injury.  You faint.  Your burning or prickling feeling gets worse when walking.  You have pain, cramps, or dizziness.  You develop a rash. MAKE SURE YOU:  Understand these instructions.  Will watch your condition.  Will get help right away if you are not doing well or get worse. Document Released: 12/31/2001 Document Revised: 04/04/2011 Document Reviewed: 10/01/2010 [facility] Center[facility]Care Patient Information 2015 [employer], [place]. This information is not intended to replace advice given to you by your health care provider. Make sure you discuss any questions you have with your health care provider.

## 2014-03-22 NOTE — ED Notes (Signed)
Pt. Stated, I feels like a burning sensation

## 2014-03-22 NOTE — ED Notes (Signed)
Declined W/C at D/C and was escorted to lobby by RN. 

## 2014-03-22 NOTE — ED Provider Notes (Signed)
CSN: [id number]     Arrival date & time 03/22/14  1410 History  This chart was scribed for Celene Skeenobyn Selia Wareing, PA-C working with No att. providers found by Elveria Risingimelie Horne, ED Scribe. This patient was seen in room TR07C/TR07C and the patient's care was started at 2:28 PM.   Chief Complaint  Patient presents with  . Hand Pain  . Tendonitis    The history is provided by the patient. No language interpreter was used.   HPI Comments: Lynn Compton is a 49 y.o. female with PMHx of Hypertension who presents to the Emergency Department complaining of worsening tingling, burning sensation in her left hand, left thumb, first and second fingers. Patient denies alleviating or aggravating factors reporting spontaneous presentation. 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 Patient reports treatment with Meloxicam but she discontinued use because she didn't like the side effects. Denies CP, shoulder pain, weakness. Patient states that she's tried to schedule an appointment with her doctor, but the office is booked.   Past Medical History  Diagnosis Date  . Hypertension    History reviewed. No pertinent past surgical history. No family history on file. History  Substance Use Topics  . Smoking status: Current Every Day Smoker  . Smokeless tobacco: Not on file  . Alcohol Use: Yes     Comment: occ   OB History    No data available     Review of Systems  Constitutional: Negative for fever and chills.  Musculoskeletal: Positive for arthralgias. Negative for neck pain.  Neurological:       Tingling   All other systems reviewed and are negative.  Allergies  Review of patient's allergies indicates no known allergies.  Home Medications   Prior to Admission medications   Medication Sig Start Date End Date Taking? Authorizing Provider   aspirin EC 81 MG tablet Take 81 mg by mouth daily.    Historical Provider, MD  carvedilol (COREG) 12.5 MG tablet Take 12.5 mg by mouth daily.    Historical Provider, MD  lisinopril-hydrochlorothiazide (PRINZIDE,ZESTORETIC) 20-12.5 MG per tablet Take 2 tablets by mouth daily.    Historical Provider, MD   Triage Vitals: BP 92/43 mmHg  Pulse 59  Temp(Src) 97.6 F (36.4 C) (Oral)  Resp 16  Ht 4\' 11"  (1.499 m)  Wt 172 lb 2.9 oz (78.1 kg)  BMI 34.76 kg/m2  SpO2 99%  LMP 03/17/2014 Physical Exam  Constitutional: She is oriented to person, place, and time. She appears well-developed and well-nourished. No distress.  HENT:  Head: Normocephalic and atraumatic.  Mouth/Throat: Oropharynx is clear and moist.  Eyes: Conjunctivae and EOM are normal.  Neck: Normal range of motion. Neck supple.  Cardiovascular: Normal rate, regular rhythm and normal heart sounds.   Pulmonary/Chest: Effort normal and breath sounds normal. No respiratory distress.  Musculoskeletal: Normal range of motion. She exhibits no edema.  Left hand nontender to palpation. No swelling or deformity. Full range of motion. Left wrist normal. Negative Tinel and Phalen sign. +2 radial pulse. Sensation intact, 2. discrimination intact.  Neurological: She is alert and oriented to person, place, and time. No sensory deficit.  Skin: Skin is warm and dry.  Psychiatric: She has a normal mood and affect. Her behavior is normal.  Nursing note and vitals reviewed.   ED Course  Procedures (including critical care  time)  COORDINATION OF CARE: 2:35 PM- Discussed treatment plan with patient at bedside and patient agreed to plan.   Labs Review Labs Reviewed - No data to display  Imaging Review No results found.   EKG Interpretation None      MDM   Final diagnoses:  Paresthesias in left hand   NAD. Asymptomatic in the ED. Negative Tinel and Phalen, unlikely carpal tunnel. Normal physical exam. No associated symptoms. Advised  her to follow-up with her PCP who diagnosed her with tendinitis. Advised NSAIDs. Stable for discharge. Return precautions given. Patient states understanding of treatment care plan and is agreeable.  I personally performed the services described in this documentation, which was scribed in my presence. The recorded information has been reviewed and is accurate.   Kathrynn Speed, PA-C 03/22/14 1443  Arby Barrette, MD 04/02/14 [phone]

## 2014-06-12 ENCOUNTER — Other Ambulatory Visit: Payer: Self-pay | Admitting: Internal Medicine

## 2014-07-08 ENCOUNTER — Ambulatory Visit: Payer: No Typology Code available for payment source

## 2014-07-15 ENCOUNTER — Ambulatory Visit: Payer: No Typology Code available for payment source

## 2014-07-17 ENCOUNTER — Ambulatory Visit: Payer: No Typology Code available for payment source

## 2014-07-22 ENCOUNTER — Ambulatory Visit: Payer: No Typology Code available for payment source

## 2014-07-24 ENCOUNTER — Ambulatory Visit: Payer: No Typology Code available for payment source

## 2014-07-31 ENCOUNTER — Ambulatory Visit: Payer: No Typology Code available for payment source

## 2016-02-24 ENCOUNTER — Encounter (HOSPITAL_COMMUNITY): Payer: Self-pay | Admitting: Emergency Medicine

## 2016-02-24 ENCOUNTER — Emergency Department (HOSPITAL_COMMUNITY)
Admission: EM | Admit: 2016-02-24 | Discharge: 2016-02-24 | Disposition: A | Discharge status: Home / Self Care | Payer: No Typology Code available for payment source | Attending: Emergency Medicine | Admitting: Emergency Medicine

## 2016-02-24 DIAGNOSIS — I10 Essential (primary) hypertension: Secondary | ICD-10-CM | POA: Insufficient documentation

## 2016-02-24 DIAGNOSIS — E119 Type 2 diabetes mellitus without complications: Secondary | ICD-10-CM | POA: Insufficient documentation

## 2016-02-24 DIAGNOSIS — Z7982 Long term (current) use of aspirin: Secondary | ICD-10-CM | POA: Insufficient documentation

## 2016-02-24 DIAGNOSIS — T783XXA Angioneurotic edema, initial encounter: Secondary | ICD-10-CM | POA: Insufficient documentation

## 2016-02-24 DIAGNOSIS — F17210 Nicotine dependence, cigarettes, uncomplicated: Secondary | ICD-10-CM | POA: Insufficient documentation

## 2016-02-24 HISTORY — DX: Type 2 diabetes mellitus without complications: E11.9

## 2016-02-24 MED ORDER — FAMOTIDINE 20 MG PO TABS: 20.0000 mg | ORAL_TABLET | Freq: Two times a day (BID) | ORAL | 0 refills | Status: DC

## 2016-02-24 MED ORDER — HYDROCHLOROTHIAZIDE 25 MG PO TABS
25.0000 mg | ORAL_TABLET | Freq: Once | ORAL | Status: DC
Start: 2016-02-24 — End: 2016-02-24
  Filled 2016-02-24: qty 1

## 2016-02-24 MED ORDER — HYDROCHLOROTHIAZIDE 25 MG PO TABS: 25.0000 mg | ORAL_TABLET | Freq: Every day | ORAL | 1 refills | Status: AC

## 2016-02-24 MED ORDER — FAMOTIDINE 20 MG PO TABS
40.0000 mg | ORAL_TABLET | Freq: Once | ORAL | Status: AC
  Administered 2016-02-24: 40 mg via ORAL
  Filled 2016-02-24: qty 2

## 2016-02-24 NOTE — Discharge Instructions (Signed)
You have been seen today for lip swelling. It is suspected that this may be from your lisinopril.   Please take the following actions:  1. Stop taking the combination lisinopril-hydrocholorthiazide. 2. New HCTZ: Replace the lisinopril medication with the newly prescribed hydrochlorothiazide. You will only take one of these pills once a day. 3. Benadryl: Take 25 mg of Benadryl every 6 hours for the next three days. 4. Pepcid: Take 20 mg of Pepcid twice a day for the next three days.   Follow up with your primary care provider as soon as possible to review your medications and make any necessary changes. Return to the ED should any symptoms worsen.

## 2016-02-24 NOTE — ED Provider Notes (Signed)
[facility]-EMERGENCY DEPT Provider Note   CSN: [id number] Arrival date & time: 02/24/16  2355     History   Chief Complaint Chief Complaint  Patient presents with  . Oral Swelling    HPI Lynn Compton is a 51 y.o. female.  HPI   Lynn Compton is a 51 y.o. female, with a history of DM and HTN, presenting to the ED with lip swelling beginning last night. Pt states she was eating pork skins and within an hour the right side of her upper lip starting tingling. Her top lip began to swell. She took 25 mg of benadryl and symptoms improved. She has eaten the pork rinds before without difficulty. She states the swelling was much worse this morning at around 4:30 am, but has since improved. She has not taken her lisinopril today.    Pt denies shortness of breath, chest pain, hives, intraoral/throat swelling, N/V, or any other complaints.      Past Medical History:  Diagnosis Date  . Diabetes mellitus without complication (HCC)   . Hypertension     There are no active problems to display for this patient.   History reviewed. No pertinent surgical history.  OB History    No data available       Home Medications    Prior to Admission medications   Medication Sig Start Date End Date Taking? Authorizing Provider  aspirin EC 81 MG tablet Take 81 mg by mouth daily.   Yes Historical Provider, MD  ibuprofen (ADVIL,MOTRIN) 600 MG tablet Take 600 mg by mouth daily as needed. 01/06/16  Yes Historical Provider, MD  lisinopril-hydrochlorothiazide (PRINZIDE,ZESTORETIC) 20-12.5 MG per tablet Take 2 tablets by mouth daily.   Yes Historical Provider, MD  metFORMIN (GLUCOPHAGE) 500 MG tablet Take 500 mg by mouth daily. 02/21/16  Yes Historical Provider, MD  Vitamin D, Ergocalciferol, (DRISDOL) 50000 units CAPS capsule Take 50,000 Units by mouth once a week. 11/26/15  Yes Historical Provider, MD  carvedilol (COREG) 12.5 MG tablet Take 12.5 mg by mouth daily.    Historical Provider, MD    famotidine (PEPCID) 20 MG tablet Take 1 tablet (20 mg total) by mouth 2 (two) times daily. 02/24/16   Orby Tangen C Galen Malkowski, PA-C  hydrochlorothiazide (HYDRODIURIL) 25 MG tablet Take 1 tablet (25 mg total) by mouth daily. 02/24/16   Anselm Pancoast, PA-C    Family History No family history on file.  Social History Social History  Substance Use Topics  . Smoking status: Current Every Day Smoker    Types: Cigarettes  . Smokeless tobacco: Never Used  . Alcohol use Yes     Comment: occ     Allergies   Patient has no known allergies.   Review of Systems Review of Systems  HENT: Positive for facial swelling. Negative for drooling and trouble swallowing.   Respiratory: Negative for shortness of breath and wheezing.   Gastrointestinal: Negative for nausea and vomiting.  Skin: Negative for rash.  All other systems reviewed and are negative.    Physical Exam Updated Vital Signs BP 117/61   Pulse 73   Temp 97.9 F (36.6 C)   Resp 18   Ht 4\' 11"  (1.499 m)   Wt 72.6 kg   LMP 12/27/2015 (Approximate) Comment: irregular periods  SpO2 98%   BMI 32.32 kg/m   Physical Exam  Constitutional: She appears well-developed and well-nourished. No distress.  HENT:  Head: Normocephalic and atraumatic.  Minor swelling noted to the right side of the  upper lip. Area of swelling is non-tender.   Eyes: Conjunctivae are normal.  Neck: Neck supple.  Cardiovascular: Normal rate, regular rhythm, normal heart sounds and intact distal pulses.   Pulmonary/Chest: Effort normal and breath sounds normal. No stridor. No respiratory distress.  Abdominal: Soft. There is no tenderness. There is no guarding.  Musculoskeletal: She exhibits no edema.  Neurological: She is alert.  Skin: Skin is warm and dry. No rash noted. She is not diaphoretic.  Psychiatric: She has a normal mood and affect. Her behavior is normal.  Nursing note and vitals reviewed.    ED Treatments / Results  Labs (all labs ordered are listed,  but only abnormal results are displayed) Labs Reviewed - No data to display  EKG  EKG Interpretation None       Radiology No results found.  Procedures Procedures (including critical care time)  Medications Ordered in ED Medications  famotidine (PEPCID) tablet 40 mg (40 mg Oral Given 02/24/16 0948)     Initial Impression / Assessment and Plan / ED Course  I have reviewed the triage vital signs and the nursing notes.  Pertinent labs & imaging results that were available during my care of the patient were reviewed by me and considered in my medical decision making (see chart for details).     Patient presents with some minor lip swelling. Suspect lisinopril use as a possible cause. Patient to stop her combination lisinopril-HCTZ. This medication was replaced with just HCTZ. She was advised she would need to follow up with her PCP as soon as possible. Further management and return precautions discussed. Patient voices understanding of all instructions and is comfortable with discharge.    Final Clinical Impressions(s) / ED Diagnoses   Final diagnoses:  Angioedema, initial encounter    New Prescriptions New Prescriptions   FAMOTIDINE (PEPCID) 20 MG TABLET    Take 1 tablet (20 mg total) by mouth 2 (two) times daily.   HYDROCHLOROTHIAZIDE (HYDRODIURIL) 25 MG TABLET    Take 1 tablet (25 mg total) by mouth daily.     Anselm PancoastShawn C Aasim Restivo, PA-C 02/24/16 1015    Maia PlanJoshua G Long, MD 02/24/16 [phone]

## 2016-02-24 NOTE — ED Triage Notes (Addendum)
Pt's lips started swelling after eating pork rinds last night-- took a benadryl-- got a little better-- but still swollen now.  Pt is also on lisinopril. Has not taken it today

## 2017-06-22 ENCOUNTER — Emergency Department (HOSPITAL_COMMUNITY)
Admission: EM | Admit: 2017-06-22 | Discharge: 2017-06-22 | Disposition: A | Discharge status: Home / Self Care | Payer: Self-pay | Attending: Emergency Medicine | Admitting: Emergency Medicine

## 2017-06-22 ENCOUNTER — Other Ambulatory Visit: Payer: Self-pay

## 2017-06-22 ENCOUNTER — Encounter (HOSPITAL_COMMUNITY): Payer: Self-pay | Admitting: Emergency Medicine

## 2017-06-22 ENCOUNTER — Emergency Department (HOSPITAL_COMMUNITY): Payer: Self-pay

## 2017-06-22 DIAGNOSIS — Z7982 Long term (current) use of aspirin: Secondary | ICD-10-CM | POA: Insufficient documentation

## 2017-06-22 DIAGNOSIS — J069 Acute upper respiratory infection, unspecified: Secondary | ICD-10-CM | POA: Insufficient documentation

## 2017-06-22 DIAGNOSIS — E119 Type 2 diabetes mellitus without complications: Secondary | ICD-10-CM | POA: Insufficient documentation

## 2017-06-22 DIAGNOSIS — B9789 Other viral agents as the cause of diseases classified elsewhere: Secondary | ICD-10-CM

## 2017-06-22 DIAGNOSIS — M899 Disorder of bone, unspecified: Secondary | ICD-10-CM | POA: Insufficient documentation

## 2017-06-22 DIAGNOSIS — Z79899 Other long term (current) drug therapy: Secondary | ICD-10-CM | POA: Insufficient documentation

## 2017-06-22 DIAGNOSIS — F17210 Nicotine dependence, cigarettes, uncomplicated: Secondary | ICD-10-CM | POA: Insufficient documentation

## 2017-06-22 DIAGNOSIS — Z7984 Long term (current) use of oral hypoglycemic drugs: Secondary | ICD-10-CM | POA: Insufficient documentation

## 2017-06-22 DIAGNOSIS — J309 Allergic rhinitis, unspecified: Secondary | ICD-10-CM | POA: Insufficient documentation

## 2017-06-22 DIAGNOSIS — Q782 Osteopetrosis: Secondary | ICD-10-CM

## 2017-06-22 DIAGNOSIS — I10 Essential (primary) hypertension: Secondary | ICD-10-CM | POA: Insufficient documentation

## 2017-06-22 NOTE — Discharge Instructions (Addendum)
Your symptoms are likely caused by a viral upper respiratory infection. Antibiotics are not helpful in treating viral infection, the virus should run its course in about 5--7 days. Please make sure you are drinking plenty of fluids. You can treat your symptoms supportively with tylenol/ibuprofen for fevers and pains, Zyrtec and Flonase to help with nasal congestion, and over the counter cough syrups and throat lozenges to help with cough. If your symptoms are not improving please follow up with you Primary doctor.   If you develop persistent fevers, shortness of breath or difficulty breathing, chest pain, severe headache and neck pain, persistent nausea and vomiting or other new or concerning symptoms return to the Emergency department.  Your chest x-ray shows evidence of a sclerotic lesion of the right humerus, this was present on a chest x-ray 10 years ago but does appear to living radiology recommends continued evaluation with MRI of the shoulder, please follow-up with your primary care doctor regarding this.

## 2017-06-22 NOTE — ED Provider Notes (Signed)
[facility] EMERGENCY DEPARTMENT Provider Note   CSN: [id number] Arrival date & time: 06/22/17  0915     History   Chief Complaint Chief Complaint  Patient presents with  . Cough    HPI Lynn Compton is a 52 y.o. female.  Lynn Compton is a 52 y.o. Female with history of hypertension and diabetes, presents to the ED for evaluation of rhinorrhea, nasal congestion, sneezing, and watery eyes.  Patient reports symptoms have been present and constant for the past 4 days without improvement.  Patient reports she is tried treating it with Zyrtec and over-the-counter Alka-Seltzer cold and sinus but her symptoms really have not improved much.  She denies shortness of breath or chest pain, reports her breathing felt a little bit wheezy and noisy.  She denies any abdominal pain, nausea or vomiting.  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     Past Medical History:  Diagnosis Date  . Diabetes mellitus without complication (HCC)   . Hypertension     There are no active problems to display for this patient.   History reviewed. No pertinent surgical history.   OB History   None      Home Medications    Prior to Admission medications   Medication Sig Start Date End Date Taking? Authorizing Provider  aspirin EC 81 MG tablet Take 81 mg by mouth daily.    [provider]  carvedilol (COREG) 12.5 MG tablet Take 12.5 mg by mouth daily.    [provider]  famotidine (PEPCID) 20 MG tablet Take 1 tablet (20 mg total) by mouth 2 (two) times daily. 02/24/16   Joy, Shawn C, PA-C  hydrochlorothiazide (HYDRODIURIL) 25 MG tablet Take 1 tablet (25 mg total) by mouth daily. 02/24/16   Joy, Shawn C, PA-C  ibuprofen (ADVIL,MOTRIN) 600 MG tablet Take 600 mg by mouth daily as needed. 01/06/16   [provider]  lisinopril-hydrochlorothiazide (PRINZIDE,ZESTORETIC) 20-12.5 MG per  tablet Take 2 tablets by mouth daily.    [provider]  metFORMIN (GLUCOPHAGE) 500 MG tablet Take 500 mg by mouth daily. 02/21/16   [provider]  Vitamin D, Ergocalciferol, (DRISDOL) 50000 units CAPS capsule Take 50,000 Units by mouth once a week. 11/26/15   [provider]    Family History History reviewed. No pertinent family history.  Social History Social History   Tobacco Use  . Smoking status: Current Every Day Smoker    Types: Cigarettes  . Smokeless tobacco: Never Used  Substance Use Topics  . Alcohol use: Yes    Comment: occ  . Drug use: No     Allergies   Patient has no known allergies.   Review of Systems Review of Systems  Constitutional: Negative for chills and fever.  HENT: Positive for congestion, postnasal drip, rhinorrhea and sneezing. Negative for ear discharge, ear pain and sore throat.   Eyes: Positive for itching. Negative for pain, discharge, redness and visual disturbance.  Respiratory: Positive for cough. Negative for chest tightness and shortness of breath.   Cardiovascular: Negative for chest pain.  Gastrointestinal: Negative for abdominal pain, diarrhea, nausea and vomiting.  Musculoskeletal: Negative for arthralgias, myalgias and neck stiffness.  Skin: Negative for color change and rash.  Neurological: Negative for light-headedness and headaches.     Physical Exam Updated Vital Signs BP 120/63 (BP Location: Right Arm)   Pulse 91   Temp  98.7 F (37.1 C) (Oral)   Resp 16   Ht  (1.499 m)   Wt 69.4 kg (153 lb)   SpO2 100%   BMI 30.90 kg/m   Physical Exam  Constitutional: She appears well-developed and well-nourished. No distress.  HENT:  Head: Normocephalic and atraumatic.  TMs clear with good landmarks, moderate nasal mucosa edema with clear rhinorrhea, posterior oropharynx clear and moist, with some erythema, no edema or exudates, uvula midline  Eyes: Right eye exhibits no discharge. Left eye  exhibits no discharge.  Neck: Normal range of motion. Neck supple.  No rigidity  Cardiovascular: Normal rate, regular rhythm, normal heart sounds and intact distal pulses.  Pulmonary/Chest: Effort normal and breath sounds normal. No stridor. No respiratory distress. She has no wheezes. She has no rales.  Respirations equal and unlabored, patient able to speak in full sentences, lungs clear to auscultation bilaterally  Abdominal: Soft. Bowel sounds are normal. She exhibits no distension and no mass. There is no tenderness. There is no guarding.  Abdomen soft, nondistended, nontender to palpation in all quadrants without guarding or peritoneal signs  Musculoskeletal: She exhibits no edema or deformity.  Lymphadenopathy:    She has no cervical adenopathy.  Neurological: She is alert. Coordination normal.  Skin: Skin is warm and dry. Capillary refill takes less than 2 seconds. She is not diaphoretic.  Psychiatric: She has a normal mood and affect. Her behavior is normal.  Nursing note and vitals reviewed.    ED Treatments / Results  Labs (all labs ordered are listed, but only abnormal results are displayed) Labs Reviewed - No data to display  EKG None  Radiology Dg Chest 2 View  Result Date: 06/22/2017 CLINICAL DATA:  Cough, congestion drainage EXAM: CHEST - 2 VIEW COMPARISON:  Chest x-ray of 08/17/2010 and CT angio chest of the same day FINDINGS: No active infiltrate or effusion is seen. Mediastinal and hilar contours are unremarkable. The heart is within normal limits in size. No acute bony abnormality is seen. There are degenerative changes in the mid lower thoracic spine. A sclerotic lesion within the right humeral neck probably represents a benign process as bone infarct or enchondroma, but compare or chest x-ray from 2008, this sclerotic focus has definitely increased in size in the interval. MRI of the right shoulder is recommended to assess further. IMPRESSION: 1. No active lung  disease. 2. Sclerotic focus within the right humeral neck has increased in size compared to chest x-ray of 2009. This most likely is a benign process, but the increase in size is somewhat unusual and MRI of the proximal right humerus-right shoulder is recommended. Electronically Signed   By: Dwyane Dee M.D.   On: 06/22/2017 13:31    Procedures Procedures (including critical care time)  Medications Ordered in ED Medications - No data to display   Initial Impression / Assessment and Plan / ED Course  I have reviewed the triage vital signs and the nursing notes.  Pertinent labs & imaging results that were available during my care of the patient were reviewed by me and considered in my medical decision making (see chart for details).  Pt presents with nasal congestion and cough. Pt is well appearing and vitals are normal. Lungs CTA on exam. Pt CXR negative for acute infiltrate. Patients symptoms are consistent with URI, likely viral etiology with some component of allergic rhinitis. Discussed that antibiotics are not indicated for viral infections. Pt will be discharged with symptomatic treatment.  Verbalizes understanding and  is agreeable with plan. Pt is hemodynamically stable & in NAD prior to dc.  Chest x-ray shows right shoulder with sclerotic lesion at the proximal humerus which appears to have increased in size since previous study in 2009.  Patient is not experiencing any shoulder pain today.  Radiology recommends follow-up with shoulder MRI.  Discussed this result with the patient provided her copy of her x-ray reports she will follow-up with her primary doctor for continued evaluation of this.  Final Clinical Impressions(s) / ED Diagnoses   Final diagnoses:  Viral URI with cough  Allergic rhinitis, unspecified seasonality, unspecified trigger  Bony sclerosis    ED Discharge Orders    None       Dartha Lodge, [place] 06/22/17 1408    Tegeler, Canary Brim, MD 06/22/17  [phone]

## 2017-06-22 NOTE — ED Triage Notes (Signed)
Pt reports runny nose, runny eyes, cough and wheezing since Sunday. Has tried OTC alkasetzer and zytec with little improvement in symptoms. VSS.

## 2017-07-12 ENCOUNTER — Encounter (HOSPITAL_COMMUNITY): Payer: Self-pay | Admitting: Emergency Medicine

## 2017-07-12 ENCOUNTER — Emergency Department (HOSPITAL_COMMUNITY)
Admission: EM | Admit: 2017-07-12 | Discharge: 2017-07-12 | Disposition: A | Discharge status: Home / Self Care | Payer: Self-pay | Attending: Emergency Medicine | Admitting: Emergency Medicine

## 2017-07-12 DIAGNOSIS — Z7984 Long term (current) use of oral hypoglycemic drugs: Secondary | ICD-10-CM | POA: Insufficient documentation

## 2017-07-12 DIAGNOSIS — Z79899 Other long term (current) drug therapy: Secondary | ICD-10-CM | POA: Insufficient documentation

## 2017-07-12 DIAGNOSIS — J069 Acute upper respiratory infection, unspecified: Secondary | ICD-10-CM | POA: Insufficient documentation

## 2017-07-12 DIAGNOSIS — F17210 Nicotine dependence, cigarettes, uncomplicated: Secondary | ICD-10-CM | POA: Insufficient documentation

## 2017-07-12 DIAGNOSIS — I10 Essential (primary) hypertension: Secondary | ICD-10-CM | POA: Insufficient documentation

## 2017-07-12 DIAGNOSIS — E119 Type 2 diabetes mellitus without complications: Secondary | ICD-10-CM | POA: Insufficient documentation

## 2017-07-12 DIAGNOSIS — J0100 Acute maxillary sinusitis, unspecified: Secondary | ICD-10-CM | POA: Insufficient documentation

## 2017-07-12 MED ORDER — PROMETHAZINE-DM 6.25-15 MG/5ML PO SYRP: 5.0000 mL | ORAL_SOLUTION | Freq: Four times a day (QID) | ORAL | 0 refills | Status: DC | PRN

## 2017-07-12 MED ORDER — AMOXICILLIN-POT CLAVULANATE 875-125 MG PO TABS: 1.0000 | ORAL_TABLET | Freq: Two times a day (BID) | ORAL | 0 refills | Status: AC

## 2017-07-12 NOTE — Discharge Instructions (Signed)
Take antibiotics as prescribed.  Take the entire course, even if your symptoms improve. Use cough syrup as needed. Continue using Flonase to help with nasal congestion. Use Tylenol or ibuprofen as needed for pain, body aches, or fevers. Make sure you are staying well-hydrated with water. Follow-up with your primary care doctor if your symptoms are not improving in 1 week. It is important that you decrease/stop smoking. Smoking will cause your body to have difficulty fighting infections, and causes lung irritation which increases coughing. Return to the emergency room if you develop high fevers, difficulty breathing, chest pain, or any new or concerning symptoms.

## 2017-07-12 NOTE — ED Notes (Signed)
ED Provider at bedside. 

## 2017-07-12 NOTE — ED Triage Notes (Signed)
Pt reports dry cough, runny nose, body aches and sore throat x2 days, hx of the same at the end of may. resp e/u, nad.

## 2017-07-12 NOTE — ED Provider Notes (Signed)
[facility] Provider Note   CSN: [id number] Arrival date & time: 07/12/17  1025     History   Chief Complaint Chief Complaint  Patient presents with  . Cough    HPI Lynn Compton is a 52 y.o. female presenting for evaluation of cough and nasal congestion.  Patient states for the past 2 to 3 days, she has had worsening nasal congestion, cough, sore throat, and intermittent body aches. Cough is nonproductive, worse at night. She reports similar symptoms the end of May, which were treated symptomatically.  The sxs mostly resolved, but she had continued nasal congestion.  She thinks she has had fevers intermittently in the past day.  She denies headache, ear pain, eye pain, chest pain, shortness of breath, nausea, vomiting, abdominal pain.  She has a history of diabetes and hypertension, both well controlled with medication.  She smokes cigarettes daily.  Denies history of asthma or COPD.  She has not followed up with her primary care doctor.  She denies history of seasonal allergies.  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  She is still using Flonase, is not using anything else for her symptoms.  HPI  Past Medical History:  Diagnosis Date  . Diabetes mellitus without complication (HCC)   . Hypertension     There are no active problems to display for this patient.   History reviewed. No pertinent surgical history.   OB History   None      Home Medications    Prior to Admission medications   Medication Sig Start Date End Date Taking? Authorizing Provider  amoxicillin-clavulanate (AUGMENTIN) 875-125 MG tablet Take 1 tablet by mouth every 12 (twelve) hours for 14 days. 07/12/17 07/26/17  Lani Mendiola, PA-C  aspirin EC 81 MG tablet Take 81 mg by mouth daily.    [provider]  carvedilol (COREG) 12.5 MG tablet Take 12.5 mg by mouth daily.    [provider]  famotidine (PEPCID) 20 MG tablet Take 1  tablet (20 mg total) by mouth 2 (two) times daily. 02/24/16   Joy, Shawn C, PA-C  hydrochlorothiazide (HYDRODIURIL) 25 MG tablet Take 1 tablet (25 mg total) by mouth daily. 02/24/16   Joy, Shawn C, PA-C  ibuprofen (ADVIL,MOTRIN) 600 MG tablet Take 600 mg by mouth daily as needed. 01/06/16   [provider]  lisinopril-hydrochlorothiazide (PRINZIDE,ZESTORETIC) 20-12.5 MG per tablet Take 2 tablets by mouth daily.    [provider]  metFORMIN (GLUCOPHAGE) 500 MG tablet Take 500 mg by mouth daily. 02/21/16   [provider]  promethazine-dextromethorphan (PROMETHAZINE-DM) 6.25-15 MG/5ML syrup Take 5 mLs by mouth 4 (four) times daily as needed. 07/12/17   Janaiah Vetrano, PA-C  Vitamin D, Ergocalciferol, (DRISDOL) 50000 units CAPS capsule Take 50,000 Units by mouth once a week. 11/26/15   [provider]    Family History No family history on file.  Social History Social History   Tobacco Use  . Smoking status: Current Every Day Smoker    Types: Cigarettes  . Smokeless tobacco: Never Used  Substance Use Topics  . Alcohol use: Yes    Comment: occ  . Drug use: No     Allergies   Patient has no known allergies.   Review of Systems Review of Systems  Constitutional: Positive for fever (subejective, intermittent).  HENT: Positive for congestion, rhinorrhea and sore throat.   Respiratory: Positive for cough.   All other systems reviewed and are negative.  Physical Exam Updated Vital Signs BP 126/68 (BP Location: Right Arm)   Pulse 89   Temp 98.6 F (37 C) (Oral)   Resp 16   Ht 4\' 11"  (1.499 m)   Wt 70.3 kg (155 lb)   LMP 04/12/2017 (Approximate)   SpO2 99%   BMI 31.31 kg/m   Physical Exam  Constitutional: She is oriented to person, place, and time. She appears well-developed and well-nourished. No distress.  Appears nontoxic  HENT:  Head: Normocephalic and atraumatic.  Right Ear: Tympanic membrane, external ear and ear canal normal.    Left Ear: Tympanic membrane, external ear and ear canal normal.  Nose: Mucosal edema present. Right sinus exhibits no maxillary sinus tenderness and no frontal sinus tenderness. Left sinus exhibits no maxillary sinus tenderness and no frontal sinus tenderness.  Mouth/Throat: Uvula is midline, oropharynx is clear and moist and mucous membranes are normal. No tonsillar exudate.  OP clear without tonsillar swelling or exudate.  Uvula midline with equal palate rise.  TMs nonerythematous and nonbulging bilaterally.  Nasal mucosal edema.  Eyes: Pupils are equal, round, and reactive to light. Conjunctivae and EOM are normal.  Neck: Normal range of motion.  Bilateral cervical lymphadenopathy  Cardiovascular: Normal rate, regular rhythm and intact distal pulses.  Pulmonary/Chest: Effort normal and breath sounds normal. She has no decreased breath sounds. She has no wheezes. She has no rhonchi. She has no rales.  Pt speaking in full sentences without difficulty.  Clear lung sounds in all fields  Abdominal: Soft. She exhibits no distension. There is no tenderness.  Musculoskeletal: Normal range of motion.  Lymphadenopathy:    She has cervical adenopathy.  Neurological: She is alert and oriented to person, place, and time.  Skin: Skin is warm.  Psychiatric: She has a normal mood and affect.  Nursing note and vitals reviewed.    ED Treatments / Results  Labs (all labs ordered are listed, but only abnormal results are displayed) Labs Reviewed - No data to display  EKG None  Radiology No results found.  Procedures Procedures (including critical care time)  Medications Ordered in ED Medications - No data to display   Initial Impression / Assessment and Plan / ED Course  I have reviewed the triage vital signs and the nursing notes.  Pertinent labs & imaging results that were available during my care of the patient were reviewed by me and considered in my medical decision making (see chart  for details).     Patient presenting for evaluation of URI symptoms.  History of similar several weeks ago, which never fully resolved.  Symptoms have worsened in the past couple days.  History of diabetes and current smoker.  I am concerned that she may have beginnings of a bacterial sinus infection. Discussed antibiotics and symptom control.  Discussed follow-up if symptoms are not improving.  I do not believe repeat chest x-ray would be beneficial today, doubt pneumonia.  No signs of strep throat or AOM on exam.  Discussed findings with patient.  At this time, patient appears safe for discharge.  Return precautions given.  Patient states she understands and agrees to plan.   Final Clinical Impressions(s) / ED Diagnoses   Final diagnoses:  Upper respiratory tract infection, unspecified type  Acute maxillary sinusitis, recurrence not specified    ED Discharge Orders        Ordered    promethazine-dextromethorphan (PROMETHAZINE-DM) 6.25-15 MG/5ML syrup  4 times daily PRN     07/12/17 1119  amoxicillin-clavulanate (AUGMENTIN) 875-125 MG tablet  Every 12 hours     07/12/17 1119       Amantha Sklar, PA-C 07/12/17 1205    Mesner, Barbara Cower, MD 07/13/17 1027

## 2018-06-23 ENCOUNTER — Other Ambulatory Visit: Payer: Self-pay

## 2018-06-23 ENCOUNTER — Ambulatory Visit (HOSPITAL_COMMUNITY)
Admission: EM | Admit: 2018-06-23 | Discharge: 2018-06-23 | Disposition: A | Discharge status: Home / Self Care | Payer: Worker's Compensation | Attending: Family Medicine | Admitting: Family Medicine

## 2018-06-23 DIAGNOSIS — Z23 Encounter for immunization: Secondary | ICD-10-CM

## 2018-06-23 DIAGNOSIS — S61211A Laceration without foreign body of left index finger without damage to nail, initial encounter: Secondary | ICD-10-CM | POA: Diagnosis not present

## 2018-06-23 MED ORDER — TETANUS-DIPHTH-ACELL PERTUSSIS 5-2.5-18.5 LF-MCG/0.5 IM SUSP
INTRAMUSCULAR | Status: AC
Start: 2018-06-23 — End: ?
  Filled 2018-06-23: qty 0.5

## 2018-06-23 MED ORDER — POVIDONE-IODINE 10 % EX SOLN
CUTANEOUS | Status: AC
  Filled 2018-06-23: qty 118

## 2018-06-23 MED ORDER — TETANUS-DIPHTH-ACELL PERTUSSIS 5-2.5-18.5 LF-MCG/0.5 IM SUSP
0.5000 mL | Freq: Once | INTRAMUSCULAR | Status: AC
  Administered 2018-06-23: 12:00:00 0.5 mL via INTRAMUSCULAR

## 2018-06-23 NOTE — ED Provider Notes (Signed)
[facility]    CSN: [id number] Arrival date & time: 06/23/18  1024     History   Chief Complaint Chief Complaint  Patient presents with  . Extremity Laceration    finger    HPI Lynn Compton is a 53 y.o. female.   Lynn Compton presents with complaints of laceration to tip of left hand pointer finger. This occurred while cutting [place] today. Cleansed the cut with soap, water and alcohol and dressed it before arriving here. No numbness or tingling. No further bleeding. Minimal pain. Unknown last TDAP. She is not on a blood thinner. Hx of dm, htn.     ROS per HPI, negative if not otherwise mentioned.      Past Medical History:  Diagnosis Date  . Diabetes mellitus without complication (HCC)   . Hypertension     There are no active problems to display for this patient.   No past surgical history on file.  OB History   No obstetric history on file.      Home Medications    Prior to Admission medications   Medication Sig Start Date End Date Taking? Authorizing Provider  aspirin EC 81 MG tablet Take 81 mg by mouth daily.    [provider]  carvedilol (COREG) 12.5 MG tablet Take 12.5 mg by mouth daily.    [provider]  famotidine (PEPCID) 20 MG tablet Take 1 tablet (20 mg total) by mouth 2 (two) times daily. 02/24/16   Joy, Shawn C, PA-C  hydrochlorothiazide (HYDRODIURIL) 25 MG tablet Take 1 tablet (25 mg total) by mouth daily. 02/24/16   Joy, Shawn C, PA-C  ibuprofen (ADVIL,MOTRIN) 600 MG tablet Take 600 mg by mouth daily as needed. 01/06/16   [provider]  lisinopril-hydrochlorothiazide (PRINZIDE,ZESTORETIC) 20-12.5 MG per tablet Take 2 tablets by mouth daily.    [provider]  metFORMIN (GLUCOPHAGE) 500 MG tablet Take 500 mg by mouth daily. 02/21/16   [provider]  promethazine-dextromethorphan (PROMETHAZINE-DM) 6.25-15 MG/5ML syrup Take 5 mLs by mouth 4 (four) times daily as needed. 07/12/17    Caccavale, Sophia, PA-C  Vitamin D, Ergocalciferol, (DRISDOL) 50000 units CAPS capsule Take 50,000 Units by mouth once a week. 11/26/15   [provider]    Family History No family history on file.  Social History Social History   Tobacco Use  . Smoking status: Current Every Day Smoker    Types: Cigarettes  . Smokeless tobacco: Never Used  Substance Use Topics  . Alcohol use: Yes    Comment: occ  . Drug use: No     Allergies   Patient has no known allergies.   Review of Systems Review of Systems   Physical Exam Triage Vital Signs ED Triage Vitals  Enc Vitals Group     BP 06/23/18 1112 112/69     Pulse Rate 06/23/18 1112 100     Resp 06/23/18 1112 16     Temp 06/23/18 1112 98.6 F (37 C)     Temp Source 06/23/18 1112 Oral     SpO2 06/23/18 1112 100 %     Weight --      Height --      Head Circumference --      Peak Flow --      Pain Score 06/23/18 1113 4     Pain Loc --      Pain Edu? --      Excl. in GC? --    No  data found.  Updated Vital Signs BP 112/69 (BP Location: Right Arm)   Pulse 100   Temp 98.6 F (37 C) (Oral)   Resp 16   SpO2 100%   Visual Acuity Right Eye Distance:   Left Eye Distance:   Bilateral Distance:    Right Eye Near:   Left Eye Near:    Bilateral Near:     Physical Exam Constitutional:      General: She is not in acute distress.    Appearance: She is well-developed.  Cardiovascular:     Rate and Rhythm: Normal rate and regular rhythm.     Heart sounds: Normal heart sounds.  Pulmonary:     Effort: Pulmonary effort is normal.     Breath sounds: Normal breath sounds.  Musculoskeletal:     Left hand: She exhibits laceration. She exhibits normal range of motion, no tenderness, no bony tenderness, normal capillary refill, no deformity and no swelling.     Comments: Tip of left hand pointer finger with curved laceration which is well approximated and already adhering closed, flap is up against nail bed/cutical but  no nail involvement. No numbness or tingling, no bony tenderness; no active bleeding; laceration approximately 0.75 cm in total   Skin:    General: Skin is warm and dry.  Neurological:     Mental Status: She is alert and oriented to person, place, and time.      UC Treatments / Results  Labs (all labs ordered are listed, but only abnormal results are displayed) Labs Reviewed - No data to display  EKG None  Radiology No results found.  Procedures Laceration Repair Date/Time: 06/23/2018 12:39 PM Performed by: Georgetta Haber, NP Authorized by: Elvina Sidle, MD   Consent:    Consent obtained:  Verbal   Consent given by:  Patient   Risks discussed:  Infection, pain, poor cosmetic result, retained foreign body, poor wound healing, nerve damage and need for additional repair   Alternatives discussed:  No treatment, delayed treatment, referral and observation Anesthesia (see MAR for exact dosages):    Anesthesia method:  None Laceration details:    Location:  Finger   Finger location:  L index finger   Length (cm):  0.8   Depth (mm):  1 Repair type:    Repair type:  Simple Exploration:    Wound exploration: wound explored through full range of motion     Wound extent: no foreign bodies/material noted, no muscle damage noted, no nerve damage noted, no tendon damage noted, no underlying fracture noted and no vascular damage noted   Treatment:    Area cleansed with:  Betadine, Shur-Clens and soap and water   Amount of cleaning:  Extensive Skin repair:    Repair method:  Tissue adhesive Approximation:    Approximation:  Close Post-procedure details:    Dressing:  Non-adherent dressing   Patient tolerance of procedure:  Tolerated well, no immediate complications   (including critical care time)  Medications Ordered in UC Medications  Tdap (BOOSTRIX) injection 0.5 mL (0.5 mLs Intramuscular Given 06/23/18 1205)    Initial Impression / Assessment and Plan / UC Course   I have reviewed the triage vital signs and the nursing notes.  Pertinent labs & imaging results that were available during my care of the patient were reviewed by me and considered in my medical decision making (see chart for details).     dermabond used to close laceration to left hand pointer finger, as already  adhering and well approximated. Not over joint. Cleansed thoroughly first. Wound care discussed. Patient verbalized understanding and agreeable to plan.   Final Clinical Impressions(s) / UC Diagnoses   Final diagnoses:  Laceration of left index finger without foreign body without damage to nail, initial encounter     Discharge Instructions     Keep glue intact as long as possible, ideally 5 days. It will eventually peel off.  Once off if still remaining healing, cleanse daily with soap and water, keep covered to keep protected.  Glue may get wet but do not saturate it.  Return to be seen for any indications of infection- redness, swelling, pus drainage or otherwise worsening.     ED Prescriptions    None     Controlled Substance Prescriptions [place] Controlled Substance Registry consulted? Not Applicable   Georgetta HaberBurky, Lawyer Washabaugh B, NP 06/23/18 1240

## 2018-06-23 NOTE — Discharge Instructions (Signed)
Keep glue intact as long as possible, ideally 5 days. It will eventually peel off.  Once off if still remaining healing, cleanse daily with soap and water, keep covered to keep protected.  Glue may get wet but do not saturate it.  Return to be seen for any indications of infection- redness, swelling, pus drainage or otherwise worsening.

## 2018-06-23 NOTE — ED Triage Notes (Signed)
Per pt she cut her left index finger this morning at work wit a knife. Says it was hard to get to stop bleeding. Bleeding has been controlled. Pt is not on blood thinners

## 2020-09-10 ENCOUNTER — Other Ambulatory Visit: Payer: Self-pay

## 2020-09-10 ENCOUNTER — Encounter (HOSPITAL_COMMUNITY): Payer: Self-pay | Admitting: Emergency Medicine

## 2020-09-10 ENCOUNTER — Ambulatory Visit (HOSPITAL_COMMUNITY)
Admission: EM | Admit: 2020-09-10 | Discharge: 2020-09-10 | Disposition: A | Discharge status: Home / Self Care | Payer: BLUE CROSS/BLUE SHIELD | Attending: Physician Assistant | Admitting: Physician Assistant

## 2020-09-10 DIAGNOSIS — J069 Acute upper respiratory infection, unspecified: Secondary | ICD-10-CM | POA: Diagnosis not present

## 2020-09-10 DIAGNOSIS — U071 COVID-19: Secondary | ICD-10-CM | POA: Diagnosis not present

## 2020-09-10 LAB — POC INFLUENZA A AND B ANTIGEN (URGENT CARE ONLY)
INFLUENZA A ANTIGEN, POC: NEGATIVE
INFLUENZA B ANTIGEN, POC: NEGATIVE

## 2020-09-10 MED ORDER — ACETAMINOPHEN 325 MG PO TABS
ORAL_TABLET | ORAL | Status: AC
  Filled 2020-09-10: qty 1

## 2020-09-10 MED ORDER — ACETAMINOPHEN 325 MG PO TABS
650.0000 mg | ORAL_TABLET | Freq: Once | ORAL | Status: AC
  Administered 2020-09-10: 650 mg via ORAL

## 2020-09-10 MED ORDER — IBUPROFEN 800 MG PO TABS
800.0000 mg | ORAL_TABLET | Freq: Once | ORAL | Status: AC
Start: 2020-09-10 — End: 2020-09-10
  Administered 2020-09-10: 800 mg via ORAL

## 2020-09-10 MED ORDER — PROMETHAZINE-DM 6.25-15 MG/5ML PO SYRP: 5.0000 mL | ORAL_SOLUTION | Freq: Every evening | ORAL | 0 refills | Status: DC | PRN

## 2020-09-10 MED ORDER — IBUPROFEN 800 MG PO TABS
ORAL_TABLET | ORAL | Status: AC
  Filled 2020-09-10: qty 1

## 2020-09-10 NOTE — ED Triage Notes (Signed)
Complains of fever nasal congestion, sore throat, general body aches.  Symptoms started Tuesday with throat itching and chills

## 2020-09-10 NOTE — ED Provider Notes (Signed)
[facility]    CSN: [id number] Arrival date & time: 09/10/20  1744      History   Chief Complaint Chief Complaint  Patient presents with   URI    HPI Lynn Compton is a 55 y.o. female.   Patient presents today with a 2-day history of URI symptoms.  Reports fever, nasal congestion, sore throat, body aches, chills.  Denies any chest pain, shortness of breath, nausea, vomiting.  She has tried Tylenol and TheraFlu without improvement of symptoms.  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  She is up-to-date on COVID-19 vaccination including boosters.  Denies any recent antibiotic use.  She denies history of allergies, asthma, COPD, smoking.  She does have several risk factors for severe disease including diabetes.   Past Medical History:  Diagnosis Date   Diabetes mellitus without complication (HCC)    Hypertension     There are no problems to display for this patient.   History reviewed. No pertinent surgical history.  OB History   No obstetric history on file.      Home Medications    Prior to Admission medications   Medication Sig Start Date End Date Taking? Authorizing Provider  glimepiride (AMARYL) 4 MG tablet Take 4 mg by mouth 2 (two) times daily. 09/06/20  Yes [provider]  hydrochlorothiazide (HYDRODIURIL) 25 MG tablet Take 1 tablet (25 mg total) by mouth daily. 02/24/16  Yes Joy, Shawn C, PA-C  metFORMIN (GLUCOPHAGE) 500 MG tablet Take 500 mg by mouth daily. 02/21/16  Yes [provider]  promethazine-dextromethorphan (PROMETHAZINE-DM) 6.25-15 MG/5ML syrup Take 5 mLs by mouth at bedtime as needed for cough. 09/10/20  Yes Alem Fahl, Noberto Retort, PA-C  aspirin EC 81 MG tablet Take 81 mg by mouth daily. Patient not taking: Reported on 09/10/2020    [provider]  carvedilol (COREG) 12.5 MG tablet Take 12.5 mg by mouth daily. Patient not taking: Reported on 09/10/2020     [provider]  famotidine (PEPCID) 20 MG tablet Take 1 tablet (20 mg total) by mouth 2 (two) times daily. Patient not taking: Reported on 09/10/2020 02/24/16   Anselm Pancoast, PA-C  ibuprofen (ADVIL,MOTRIN) 600 MG tablet Take 600 mg by mouth daily as needed. 01/06/16   [provider]  lisinopril-hydrochlorothiazide (PRINZIDE,ZESTORETIC) 20-12.5 MG per tablet Take 2 tablets by mouth daily. Patient not taking: Reported on 09/10/2020    [provider]  Vitamin D, Ergocalciferol, (DRISDOL) 50000 units CAPS capsule Take 50,000 Units by mouth once a week. 11/26/15   [provider]    Family History Family History  Problem Relation Age of Onset   Diabetes Mother    CAD Father     Social History Social History   Tobacco Use   Smoking status: Every Day    Types: Cigarettes   Smokeless tobacco: Never  Vaping Use   Vaping Use: Never used  Substance Use Topics   Alcohol use: Yes    Comment: occ   Drug use: No     Allergies   Patient has no known allergies.   Review of Systems Review of Systems  Constitutional:  Positive for activity change, appetite change, chills, fatigue and fever.  HENT:  Positive for congestion, postnasal drip and sore throat. Negative for sinus pressure and sneezing.   Respiratory:  Positive for cough. Negative for shortness of breath.   Cardiovascular:  Negative for chest  pain.  Gastrointestinal:  Negative for abdominal pain, diarrhea, nausea and vomiting.  Musculoskeletal:  Positive for arthralgias and myalgias.  Neurological:  Positive for headaches. Negative for dizziness and light-headedness.    Physical Exam Triage Vital Signs ED Triage Vitals  Enc Vitals Group     BP 09/10/20 1842 119/68     Pulse Rate 09/10/20 1842 (!) 114     Resp 09/10/20 1842 20     Temp 09/10/20 1842 (!) 102.7 F (39.3 C)     Temp Source 09/10/20 1842 Oral     SpO2 09/10/20 1842 97 %     Weight --      Height --      Head  Circumference --      Peak Flow --      Pain Score 09/10/20 1837 8     Pain Loc --      Pain Edu? --      Excl. in GC? --    No data found.  Updated Vital Signs BP 119/60 (BP Location: Right Arm)   Pulse (!) 111   Temp (!) 100.7 F (38.2 C) (Oral)   Resp (!) 22   SpO2 96%   Visual Acuity Right Eye Distance:   Left Eye Distance:   Bilateral Distance:    Right Eye Near:   Left Eye Near:    Bilateral Near:     Physical Exam Vitals reviewed.  Constitutional:      General: She is awake. She is not in acute distress.    Appearance: Normal appearance. She is normal weight. She is not ill-appearing.     Comments: Very pleasant female appears stated age in no acute distress sitting comfortably in exam room  HENT:     Head: Normocephalic and atraumatic.     Right Ear: Tympanic membrane, ear canal and external ear normal. Tympanic membrane is not erythematous or bulging.     Left Ear: Tympanic membrane, ear canal and external ear normal. Tympanic membrane is not erythematous or bulging.     Nose:     Right Sinus: No maxillary sinus tenderness or frontal sinus tenderness.     Left Sinus: No maxillary sinus tenderness or frontal sinus tenderness.     Mouth/Throat:     Pharynx: Uvula midline. No oropharyngeal exudate or posterior oropharyngeal erythema.     Comments: Normal-appearing posterior oropharynx Cardiovascular:     Rate and Rhythm: Regular rhythm. Tachycardia present.     Heart sounds: Normal heart sounds, S1 normal and S2 normal. No murmur heard. Pulmonary:     Effort: Pulmonary effort is normal.     Breath sounds: Normal breath sounds. No wheezing, rhonchi or rales.     Comments: Clear auscultation bilaterally Musculoskeletal:     Right lower leg: No edema.     Left lower leg: No edema.  Lymphadenopathy:     Head:     Right side of head: No submental, submandibular or tonsillar adenopathy.     Left side of head: No submental, submandibular or tonsillar adenopathy.      Cervical: No cervical adenopathy.  Psychiatric:        Behavior: Behavior is cooperative.     UC Treatments / Results  Labs (all labs ordered are listed, but only abnormal results are displayed) Labs Reviewed  SARS CORONAVIRUS 2 (TAT 6-24 HRS)  POC INFLUENZA A AND B ANTIGEN (URGENT CARE ONLY)    EKG   Radiology No results found.  Procedures Procedures (including critical  care time)  Medications Ordered in UC Medications  acetaminophen (TYLENOL) tablet 650 mg (650 mg Oral Given 09/10/20 1850)  ibuprofen (ADVIL) tablet 800 mg (800 mg Oral Given 09/10/20 1958)    Initial Impression / Assessment and Plan / UC Course  I have reviewed the triage vital signs and the nursing notes.  Pertinent labs & imaging results that were available during my care of the patient were reviewed by me and considered in my medical decision making (see chart for details).      Patient was given Tylenol and ibuprofen in clinic with improvement of fever and symptoms.  She was encouraged to alternate over-the-counter medications.  Discussed likely viral etiology.  Flu test was negative.  COVID test is pending.  Patient was given work excuse note with current CDC return to work guidelines based on test result.  Encouraged her to use over-the-counter medications including Mucinex and Flonase for additional symptom relief.  She was prescribed meclizine DM for cough with instruction not to drive or drink alcohol while taking this medication as drowsiness is a common side effect.  Discussed alarm symptoms that warrant emergent evaluation.  Strict return precautions given to which patient expressed understanding.  Final Clinical Impressions(s) / UC Diagnoses   Final diagnoses:  Viral URI with cough     Discharge Instructions      Your flu test was negative.  Please use over-the-counter medications including Tylenol and ibuprofen for pain and fever relief.  I have prescribed Promethazine DM for  nocturnal cough.  Do not drive or drink alcohol while taking this as it make you sleepy.  We will contact you if your COVID is positive.  Please stay isolate until you get results.  If you have any worsening symptoms including high fever, difficulty eating and drinking, chest pain, shortness of breath you need to go to the hospital.     ED Prescriptions     Medication Sig Dispense Auth. Provider   promethazine-dextromethorphan (PROMETHAZINE-DM) 6.25-15 MG/5ML syrup Take 5 mLs by mouth at bedtime as needed for cough. 118 mL Alsie Younes K, PA-C      PDMP not reviewed this encounter.   Jeani Hawking, PA-C 09/10/20 2006

## 2020-09-10 NOTE — Discharge Instructions (Addendum)
Your flu test was negative.  Please use over-the-counter medications including Tylenol and ibuprofen for pain and fever relief.  I have prescribed Promethazine DM for nocturnal cough.  Do not drive or drink alcohol while taking this as it make you sleepy.  We will contact you if your COVID is positive.  Please stay isolate until you get results.  If you have any worsening symptoms including high fever, difficulty eating and drinking, chest pain, shortness of breath you need to go to the hospital.

## 2020-09-11 LAB — SARS CORONAVIRUS 2 (TAT 6-24 HRS): SARS Coronavirus 2: POSITIVE — AB

## 2021-02-03 ENCOUNTER — Encounter (HOSPITAL_COMMUNITY): Payer: Self-pay | Admitting: Emergency Medicine

## 2021-02-03 ENCOUNTER — Other Ambulatory Visit: Payer: Self-pay

## 2021-02-03 ENCOUNTER — Ambulatory Visit (HOSPITAL_COMMUNITY)
Admission: EM | Admit: 2021-02-03 | Discharge: 2021-02-03 | Disposition: A | Discharge status: Home / Self Care | Payer: 59 | Attending: Family Medicine | Admitting: Family Medicine

## 2021-02-03 DIAGNOSIS — F17210 Nicotine dependence, cigarettes, uncomplicated: Secondary | ICD-10-CM | POA: Insufficient documentation

## 2021-02-03 DIAGNOSIS — J069 Acute upper respiratory infection, unspecified: Secondary | ICD-10-CM | POA: Diagnosis not present

## 2021-02-03 DIAGNOSIS — Z20822 Contact with and (suspected) exposure to covid-19: Secondary | ICD-10-CM | POA: Diagnosis not present

## 2021-02-03 DIAGNOSIS — R059 Cough, unspecified: Secondary | ICD-10-CM | POA: Diagnosis present

## 2021-02-03 LAB — SARS CORONAVIRUS 2 (TAT 6-24 HRS): SARS Coronavirus 2: NEGATIVE

## 2021-02-03 NOTE — Discharge Instructions (Signed)
We have tested you for COVID and the results will likely come back tomorrow.   If you have difficulty breathing, chest pain, you are vomiting and can't keep any liquids down and you aren't urinating at least 50% of your normal amount, you should be seen at the emergency room right away.  If you aren't improving over the next week, please follow up with your regular medical provider. ° °For your congestion, you can use nasal saline spray.  You can also use a humidifier.  You can also use honey as needed for cough by the spoonful or in a warm liquid (do not give honey to an infant less than a year old).  °

## 2021-02-03 NOTE — ED Triage Notes (Signed)
Pt reports congestion and dry cough since this past weekend. 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

## 2021-02-03 NOTE — ED Provider Notes (Signed)
[facility]    CSN: [id number] Arrival date & time: 02/03/21  0807      History   Chief Complaint Chief Complaint  Patient presents with   Cough   Nasal Congestion    HPI Lynn Compton is a 56 y.o. female.   Cough Started 3 days ago No fevers, felt warm yesterday but didn't check Endorses congestion, rhinorrhea, dry cough Denies  headache, fatigue, myalgias, nausea, vomiting, diarrhea, chest pain, SOB Has been drinking normally, has normal UOP  No known sick contacts, but works in [place] not been tested for [place] in August  Has DM, CBGs WNL  No history of COPD/asthma     Past Medical History:  Diagnosis Date   Diabetes mellitus without complication ([place])    Hypertension     There are no problems to display for this patient.   History reviewed. No pertinent surgical history.  OB History   No obstetric history on file.      Home Medications    Prior to Admission medications   Medication Sig Start Date End Date Taking? Authorizing Provider  aspirin EC 81 MG tablet Take 81 mg by mouth daily. Patient not taking: Reported on 09/10/2020    [provider]  carvedilol (COREG) 12.5 MG tablet Take 12.5 mg by mouth daily. Patient not taking: Reported on 09/10/2020    [provider]  famotidine (PEPCID) 20 MG tablet Take 1 tablet (20 mg total) by mouth 2 (two) times daily. Patient not taking: Reported on 09/10/2020 02/24/16   Joy, Raquel Sarna C, PA-C  glimepiride (AMARYL) 4 MG tablet Take 4 mg by mouth 2 (two) times daily. 09/06/20   [provider]  hydrochlorothiazide (HYDRODIURIL) 25 MG tablet Take 1 tablet (25 mg total) by mouth daily. 02/24/16   Joy, Shawn C, PA-C  ibuprofen (ADVIL,MOTRIN) 600 MG tablet Take 600 mg by mouth daily as needed. 01/06/16   [provider]  lisinopril-hydrochlorothiazide (PRINZIDE,ZESTORETIC) 20-12.5 MG per tablet Take 2 tablets by mouth daily. Patient not taking: Reported  on 09/10/2020    [provider]  metFORMIN (GLUCOPHAGE) 500 MG tablet Take 500 mg by mouth daily. 02/21/16   [provider]  promethazine-dextromethorphan (PROMETHAZINE-DM) 6.25-15 MG/5ML syrup Take 5 mLs by mouth at bedtime as needed for cough. 09/10/20   Raspet, Derry Skill, PA-C  Vitamin D, Ergocalciferol, (DRISDOL) 50000 units CAPS capsule Take 50,000 Units by mouth once a week. 11/26/15   [provider]    Family History Family History  Problem Relation Age of Onset   Diabetes Mother    CAD Father     Social History Social History   Tobacco Use   Smoking status: Every Day    Types: Cigarettes   Smokeless tobacco: Never  Vaping Use   Vaping Use: Never used  Substance Use Topics   Alcohol use: Yes    Comment: occ   Drug use: No     Allergies   Patient has no known allergies.   Review of Systems Review of Systems  All other systems reviewed and are negative.  Per HPI Physical Exam Triage Vital Signs ED Triage Vitals  Enc Vitals Group     BP      Pulse      Resp      Temp      Temp src      SpO2      Weight      Height  Head Circumference      Peak Flow      Pain Score      Pain Loc      Pain Edu?      Excl. in [place]?    No data found.  Updated Vital Signs BP (!) 138/57 (BP Location: Right Arm)    Pulse 96    Temp 97.9 F (36.6 C) (Oral)    Resp 18    SpO2 100%   Visual Acuity Right Eye Distance:   Left Eye Distance:   Bilateral Distance:    Right Eye Near:   Left Eye Near:    Bilateral Near:     Physical Exam Constitutional:      General: She is not in acute distress.    Appearance: She is well-developed. She is not ill-appearing or toxic-appearing.  HENT:     Head: Normocephalic and atraumatic.     Right Ear: Tympanic membrane, ear canal and external ear normal.     Left Ear: Tympanic membrane, ear canal and external ear normal.     Nose: Nose normal. No congestion or rhinorrhea.     Mouth/Throat:     Mouth:  Mucous membranes are dry.     Pharynx: No oropharyngeal exudate or posterior oropharyngeal erythema.  Eyes:     Conjunctiva/sclera: Conjunctivae normal.  Cardiovascular:     Rate and Rhythm: Normal rate.  Pulmonary:     Effort: Pulmonary effort is normal. No respiratory distress.     Breath sounds: Normal breath sounds. No wheezing, rhonchi or rales.  Musculoskeletal:     Cervical back: Neck supple. No rigidity or tenderness.  Lymphadenopathy:     Cervical: No cervical adenopathy.  Skin:    General: Skin is warm and dry.     Capillary Refill: Capillary refill takes less than 2 seconds.  Neurological:     Mental Status: She is alert and oriented to person, place, and time.     UC Treatments / Results  Labs (all labs ordered are listed, but only abnormal results are displayed) Labs Reviewed  SARS CORONAVIRUS 2 (TAT 6-24 HRS)    EKG   Radiology No results found.  Procedures Procedures (including critical care time)  Medications Ordered in UC Medications - No data to display  Initial Impression / Assessment and Plan / UC Course  I have reviewed the triage vital signs and the nursing notes.  Pertinent labs & imaging results that were available during my care of the patient were reviewed by me and considered in my medical decision making (see chart for details).     VSS.  COVID test performed.  Symptoms do not seem consistent with flu and would not change management.  Advised of OTC treatments and ED precautions, see AVS.  Specifcally advised to avoid OTC medications with "DM."   Final Clinical Impressions(s) / UC Diagnoses   Final diagnoses:  Viral URI with cough     Discharge Instructions      We have tested you for COVID and the results will likely come back tomorrow.  If you have difficulty breathing, chest pain, you are vomiting and can't keep any liquids down and you aren't urinating at least 50% of your normal amount, you should be seen at the emergency  room right away.  If you aren't improving over the next week, please follow up with your regular medical provider.  For your congestion, you can use nasal saline spray.  You can also use a humidifier.  You can also use honey as needed for cough by the spoonful or in a warm liquid (do not give honey to an infant less than a year old).      ED Prescriptions   None    PDMP not reviewed this encounter.   [place], Bernita Raisin, DO 02/03/21 [phone]

## 2021-04-01 ENCOUNTER — Other Ambulatory Visit: Payer: Self-pay

## 2021-04-01 ENCOUNTER — Ambulatory Visit (HOSPITAL_COMMUNITY)
Admission: EM | Admit: 2021-04-01 | Discharge: 2021-04-01 | Disposition: A | Discharge status: Home / Self Care | Payer: 59 | Attending: Internal Medicine | Admitting: Internal Medicine

## 2021-04-01 ENCOUNTER — Encounter (HOSPITAL_COMMUNITY): Payer: Self-pay

## 2021-04-01 DIAGNOSIS — A084 Viral intestinal infection, unspecified: Secondary | ICD-10-CM

## 2021-04-01 MED ORDER — ONDANSETRON 4 MG PO TBDP: 4.0000 mg | ORAL_TABLET | Freq: Three times a day (TID) | ORAL | 0 refills | Status: DC | PRN

## 2021-04-01 MED ORDER — ONDANSETRON 4 MG PO TBDP: 4.0000 mg | ORAL_TABLET | Freq: Once | ORAL | Status: DC

## 2021-04-01 NOTE — Discharge Instructions (Signed)
Increase oral fluid intake ?Take medications as prescribed ?Strict hand hygiene recommended ?Return to urgent care if symptoms worsen ?

## 2021-04-01 NOTE — ED Triage Notes (Signed)
Pt presents with vomiting and diarrhea since waking up this morning. 

## 2021-04-01 NOTE — ED Provider Notes (Signed)
?[facility] ? ? ? ?CSN: [id number] ?Arrival date & time: 04/01/21  1211 ? ? ?  ? ?History   ?Chief Complaint ?Chief Complaint  ?Patient presents with  ? Emesis  ?  dia  ? Diarrhea  ? ? ?HPI ?Lynn Compton is a 56 y.o. female comes to the urgent care with 1 day history of vomiting, diarrhea and fever.  Patient's symptoms started in the early hours of this morning.  She has had 1 episode of emesis.  Vomitus was nonbloody nonbilious.  No associated with some abdominal pain.  She has had 3 bouts of diarrheal bowel movements.  Stools are not mucoid or bloody.  Patient denies any changes in dietary habits.  She had a fever of 100 ?F.  No rash noted.  No sick contacts noted.  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  She is fully vaccinated against COVID-19 virus.  No sore throat, shortness of breath or cough.  ? ?HPI ? ?Past Medical History:  ?Diagnosis Date  ? Diabetes mellitus without complication (HCC)   ? Hypertension   ? ? ?There are no problems to display for this patient. ? ? ?History reviewed. No pertinent surgical history. ? ?OB History   ?No obstetric history on file. ?  ? ? ? ?Home Medications   ? ?Prior to Admission medications   ?Medication Sig Start Date End Date Taking? Authorizing Provider  ?ondansetron (ZOFRAN-ODT) 4 MG disintegrating tablet Take 1 tablet (4 mg total) by mouth every 8 (eight) hours as needed for nausea or vomiting. 04/01/21  Yes Artemis Koller, Britta Mccreedy, MD  ?glimepiride (AMARYL) 4 MG tablet Take 4 mg by mouth 2 (two) times daily. 09/06/20   [provider]  ?hydrochlorothiazide (HYDRODIURIL) 25 MG tablet Take 1 tablet (25 mg total) by mouth daily. 02/24/16   Joy, Shawn C, PA-C  ?ibuprofen (ADVIL,MOTRIN) 600 MG tablet Take 600 mg by mouth daily as needed. 01/06/16   [provider]  ?metFORMIN (GLUCOPHAGE) 500 MG tablet Take 500 mg by mouth daily. 02/21/16   [provider]  ?promethazine-dextromethorphan (PROMETHAZINE-DM) 6.25-15 MG/5ML syrup Take 5 mLs by mouth  at bedtime as needed for cough. 09/10/20   Raspet, Noberto Retort, PA-C  ?Vitamin D, Ergocalciferol, (DRISDOL) 50000 units CAPS capsule Take 50,000 Units by mouth once a week. 11/26/15   [provider]  ? ? ?Family History ?Family History  ?Problem Relation Age of Onset  ? Diabetes Mother   ? CAD Father   ? ? ?Social History ?Social History  ? ?Tobacco Use  ? Smoking status: Every Day  ?  Types: Cigarettes  ? Smokeless tobacco: Never  ?Vaping Use  ? Vaping Use: Never used  ?Substance Use Topics  ? Alcohol use: Yes  ?  Comment: occ  ? Drug use: No  ? ? ? ?Allergies   ?Patient has no known allergies. ? ? ?Review of Systems ?Review of Systems  ?HENT: Negative.    ?Respiratory: Negative.    ?Gastrointestinal:  Positive for abdominal pain, diarrhea, nausea and vomiting. Negative for anal bleeding, blood in stool and constipation.  ?Psychiatric/Behavioral: Negative.    ? ? ?Physical Exam ?Triage Vital Signs ?ED Triage Vitals [04/01/21 1323]  ?Enc Vitals Group  ?   BP 119/62  ?   Pulse Rate (!) 110  ?   Resp 18  ?   Temp 99.3 ?F (37.4 ?C)  ?   Temp Source Oral  ?   SpO2 98 %  ?  Weight   ?   Height   ?   Head Circumference   ?   Peak Flow   ?   Pain Score 1  ?   Pain Loc   ?   Pain Edu?   ?   Excl. in GC?   ? ?No data found. ? ?Updated Vital Signs ?BP 119/62 (BP Location: Left Arm)   Pulse (!) 110   Temp 99.3 ?F (37.4 ?C) (Oral)   Resp 18   SpO2 98%  ? ?Visual Acuity ?Right Eye Distance:   ?Left Eye Distance:   ?Bilateral Distance:   ? ?Right Eye Near:   ?Left Eye Near:    ?Bilateral Near:    ? ?Physical Exam ?Vitals and nursing note reviewed.  ?Constitutional:   ?   Appearance: She is ill-appearing.  ?Cardiovascular:  ?   Rate and Rhythm: Normal rate and regular rhythm.  ?   Pulses: Normal pulses.  ?   Heart sounds: Normal heart sounds.  ?Pulmonary:  ?   Effort: Pulmonary effort is normal.  ?   Breath sounds: Normal breath sounds.  ?Abdominal:  ?   General: Abdomen is flat. Bowel sounds are normal.  ?Musculoskeletal:      ?   General: No swelling. Normal range of motion.  ?Neurological:  ?   Mental Status: She is alert.  ? ? ? ?UC Treatments / Results  ?Labs ?(all labs ordered are listed, but only abnormal results are displayed) ?Labs Reviewed - No data to display ? ?EKG ? ? ?Radiology ?No results found. ? ?Procedures ?Procedures (including critical care time) ? ?Medications Ordered in UC ?Medications - No data to display ? ?Initial Impression / Assessment and Plan / UC Course  ?I have reviewed the triage vital signs and the nursing notes. ? ?Pertinent labs & imaging results that were available during my care of the patient were reviewed by me and considered in my medical decision making (see chart for details). ? ?  ? ?1.  Viral gastroenteritis: ?Increase oral fluid intake ?Zofran ODT needed for nausea/vomiting ?Strict hand hygiene ?Do not go to work until diarrhea has subsided ?Return to urgent care if symptoms worsen. ?Final Clinical Impressions(s) / UC Diagnoses  ? ?Final diagnoses:  ?Viral gastroenteritis  ? ? ? ?Discharge Instructions   ? ?  ?Increase oral fluid intake ?Take medications as prescribed ?Strict hand hygiene recommended ?Return to urgent care if symptoms worsen ? ? ?ED Prescriptions   ? ? Medication Sig Dispense Auth. Provider  ? ondansetron (ZOFRAN-ODT) 4 MG disintegrating tablet Take 1 tablet (4 mg total) by mouth every 8 (eight) hours as needed for nausea or vomiting. 20 tablet Ondine Gemme, Britta Mccreedy, MD  ? ?  ? ?PDMP not reviewed this encounter. ?  ?Merrilee Jansky, MD ?04/01/21 1457 ? ?

## 2021-04-21 ENCOUNTER — Ambulatory Visit (INDEPENDENT_AMBULATORY_CARE_PROVIDER_SITE_OTHER): Payer: 59

## 2021-04-21 ENCOUNTER — Encounter (HOSPITAL_COMMUNITY): Payer: Self-pay

## 2021-04-21 ENCOUNTER — Ambulatory Visit (HOSPITAL_COMMUNITY)
Admission: EM | Admit: 2021-04-21 | Discharge: 2021-04-21 | Disposition: A | Discharge status: Home / Self Care | Payer: 59 | Attending: Internal Medicine | Admitting: Internal Medicine

## 2021-04-21 ENCOUNTER — Other Ambulatory Visit: Payer: Self-pay

## 2021-04-21 DIAGNOSIS — J189 Pneumonia, unspecified organism: Secondary | ICD-10-CM

## 2021-04-21 DIAGNOSIS — J029 Acute pharyngitis, unspecified: Secondary | ICD-10-CM

## 2021-04-21 DIAGNOSIS — R062 Wheezing: Secondary | ICD-10-CM

## 2021-04-21 DIAGNOSIS — R0602 Shortness of breath: Secondary | ICD-10-CM

## 2021-04-21 MED ORDER — AZITHROMYCIN 250 MG PO TABS: 250.0000 mg | ORAL_TABLET | Freq: Every day | ORAL | 0 refills | Status: DC

## 2021-04-21 NOTE — Discharge Instructions (Addendum)
We will treat you for a pneumonia with an antibiotic.  Take 2 tablets on the first day, then 1 tablet daily for the next 4 days.  For your cough, you can use over-the-counter cough medicine or honey.  If you are not having improvement in your symptoms, you can return after you complete the antibiotic.  If you severely worsen, especially if you are having difficulty breathing, you should be seen at the emergency room right away. ?

## 2021-04-21 NOTE — ED Provider Notes (Signed)
?[facility] ? ? ? ?CSN: [id number] ?Arrival date & time: 04/21/21  1121 ? ? ?  ? ?History   ?Chief Complaint ?Chief Complaint  ?Patient presents with  ? Cough  ? ? ?HPI ?Lynn Compton is a 56 y.o. female.  ? ?Cough ?Started 7 days ago ?No fevers  ?Had congestion at first, which is improving, but has been having a dry cough since then ?Throat is getting irritated from coughing ?Feels like muscles are also getting sore from coughing ?Denies current congestion, rhinorrhea, headache, fatigue, myalgias, nausea, vomiting, diarrhea, chest pain, shortness of breath ?Has been drinking normally, has normal UOP  ?Denies a history of lung problems ?Reports occasional, not every day smoker ? ? ? ?Past Medical History:  ?Diagnosis Date  ? Diabetes mellitus without complication (HCC)   ? Hypertension   ? ? ?There are no problems to display for this patient. ? ? ?History reviewed. No pertinent surgical history. ? ?OB History   ?No obstetric history on file. ?  ? ? ? ?Home Medications   ? ?Prior to Admission medications   ?Medication Sig Start Date End Date Taking? Authorizing Provider  ?azithromycin (ZITHROMAX) 250 MG tablet Take 1 tablet (250 mg total) by mouth daily. Take first 2 tablets together, then 1 every day until finished. 04/21/21  Yes Matai Carpenito, Solmon IceBailey J, DO  ?glimepiride (AMARYL) 4 MG tablet Take 4 mg by mouth 2 (two) times daily. 09/06/20   [provider]  ?hydrochlorothiazide (HYDRODIURIL) 25 MG tablet Take 1 tablet (25 mg total) by mouth daily. 02/24/16   Joy, Shawn C, PA-C  ?ibuprofen (ADVIL,MOTRIN) 600 MG tablet Take 600 mg by mouth daily as needed. 01/06/16   [provider]  ?metFORMIN (GLUCOPHAGE) 500 MG tablet Take 500 mg by mouth daily. 02/21/16   [provider]  ?ondansetron (ZOFRAN-ODT) 4 MG disintegrating tablet Take 1 tablet (4 mg total) by mouth every 8 (eight) hours as needed for nausea or vomiting. 04/01/21   Merrilee JanskyLamptey, Philip O, MD  ?promethazine-dextromethorphan  (PROMETHAZINE-DM) 6.25-15 MG/5ML syrup Take 5 mLs by mouth at bedtime as needed for cough. 09/10/20   Raspet, Noberto RetortErin K, PA-C  ?Vitamin D, Ergocalciferol, (DRISDOL) 50000 units CAPS capsule Take 50,000 Units by mouth once a week. 11/26/15   [provider]  ? ? ?Family History ?Family History  ?Problem Relation Age of Onset  ? Diabetes Mother   ? CAD Father   ? ? ?Social History ?Social History  ? ?Tobacco Use  ? Smoking status: Every Day  ?  Types: Cigarettes  ? Smokeless tobacco: Never  ?Vaping Use  ? Vaping Use: Never used  ?Substance Use Topics  ? Alcohol use: Yes  ?  Comment: occ  ? Drug use: No  ? ? ? ?Allergies   ?Patient has no known allergies. ? ? ?Review of Systems ?Review of Systems  ?All other systems reviewed and are negative. ?Per HPI ? ?Physical Exam ?Triage Vital Signs ?ED Triage Vitals  ?Enc Vitals Group  ?   BP 04/21/21 1220 (!) 116/44  ?   Pulse Rate 04/21/21 1220 (!) 102  ?   Resp 04/21/21 1220 18  ?   Temp 04/21/21 1220 98.1 ?F (36.7 ?C)  ?   Temp Source 04/21/21 1220 Oral  ?   SpO2 04/21/21 1220 100 %  ?   Weight --   ?   Height --   ?   Head Circumference --   ?   Peak Flow --   ?  Pain Score 04/21/21 1217 0  ?   Pain Loc --   ?   Pain Edu? --   ?   Excl. in GC? --   ? ?No data found. ? ?Updated Vital Signs ?BP (!) 116/44 (BP Location: Left Arm)   Pulse (!) 102   Temp 98.1 ?F (36.7 ?C) (Oral)   Resp 18   SpO2 100%  ? ?Visual Acuity ?Right Eye Distance:   ?Left Eye Distance:   ?Bilateral Distance:   ? ?Right Eye Near:   ?Left Eye Near:    ?Bilateral Near:    ? ?Physical Exam ?Constitutional:   ?   General: She is not in acute distress. ?   Appearance: She is well-developed. She is not ill-appearing or toxic-appearing.  ?HENT:  ?   Head: Normocephalic and atraumatic.  ?   Right Ear: Tympanic membrane, ear canal and external ear normal.  ?   Left Ear: Tympanic membrane, ear canal and external ear normal.  ?   Nose: Nose normal. No congestion or rhinorrhea.  ?   Mouth/Throat:  ?   Mouth:  Mucous membranes are moist.  ?   Pharynx: No oropharyngeal exudate or posterior oropharyngeal erythema.  ?Eyes:  ?   Conjunctiva/sclera: Conjunctivae normal.  ?Cardiovascular:  ?   Rate and Rhythm: Normal rate.  ?Pulmonary:  ?   Effort: Pulmonary effort is normal. No respiratory distress.  ?   Breath sounds: Normal breath sounds. No wheezing, rhonchi or rales.  ?Musculoskeletal:     ?   General: Normal range of motion.  ?   Cervical back: Neck supple. No rigidity or tenderness.  ?Lymphadenopathy:  ?   Cervical: No cervical adenopathy.  ?Skin: ?   General: Skin is warm and dry.  ?   Capillary Refill: Capillary refill takes less than 2 seconds.  ?Neurological:  ?   Mental Status: She is alert and oriented to person, place, and time.  ? ? ? ?UC Treatments / Results  ?Labs ?(all labs ordered are listed, but only abnormal results are displayed) ?Labs Reviewed - No data to display ? ?EKG ? ? ?Radiology ?DG Chest 2 View ? ?Result Date: 04/21/2021 ?CLINICAL DATA:  Cough and wheezing for 1 week.  Sore throat. EXAM: CHEST - 2 VIEW COMPARISON:  06/22/2017 and CT chest 08/17/2010. FINDINGS: Trachea is midline. Heart is enlarged. Mild central and lower lung zone predominant interstitial prominence and indistinctness. No dense airspace consolidation. No pleural fluid. IMPRESSION: Mild central and lower lung zone predominant interstitial prominence and indistinctness, findings suggesting a viral or atypical pneumonia. Electronically Signed   By: Leanna Battles M.D.   On: 04/21/2021 12:53   ? ?Procedures ?Procedures (including critical care time) ? ?Medications Ordered in UC ?Medications - No data to display ? ?Initial Impression / Assessment and Plan / UC Course  ?I have reviewed the triage vital signs and the nursing notes. ? ?Pertinent labs & imaging results that were available during my care of the patient were reviewed by me and considered in my medical decision making (see chart for details). ? ?  ? ?VSS aside from mild  tachycardia.  CXR with possible viral vs atypical pneumonia, will send Rx for azithromycin.  No underlying lung pathology to suggest need for inhaler.  Recommend over-the-counter cough suppressants and provided reassurance.  Recommend follow-up in 1 to 2 weeks if not improving.  Given return precautions, see AVS. ? ?Final Clinical Impressions(s) / UC Diagnoses  ? ?Final diagnoses:  ?Atypical pneumonia  ? ? ? ?  Discharge Instructions   ? ?  ?We will treat you for a pneumonia with an antibiotic.  Take 2 tablets on the first day, then 1 tablet daily for the next 4 days.  For your cough, you can use over-the-counter cough medicine or honey.  If you are not having improvement in your symptoms, you can return after you complete the antibiotic.  If you severely worsen, especially if you are having difficulty breathing, you should be seen at the emergency room right away. ? ? ? ? ?ED Prescriptions   ? ? Medication Sig Dispense Auth. Provider  ? azithromycin (ZITHROMAX) 250 MG tablet Take 1 tablet (250 mg total) by mouth daily. Take first 2 tablets together, then 1 every day until finished. 6 tablet Maimouna Rondeau, Solmon Ice, DO  ? ?  ? ?PDMP not reviewed this encounter. ?  ?Unknown Jim, DO ?04/21/21 1310 ? ?

## 2021-04-21 NOTE — ED Triage Notes (Signed)
Pt presents today with coughing and sore throat that's been going on for a week. ?

## 2021-09-10 ENCOUNTER — Other Ambulatory Visit: Payer: Self-pay | Admitting: Internal Medicine

## 2021-09-11 LAB — COMPLETE METABOLIC PANEL WITH GFR
AG Ratio: 1.3 (calc) (ref 1.0–2.5)
ALT: 11 U/L (ref 6–29)
AST: 15 U/L (ref 10–35)
Albumin: 4.2 g/dL (ref 3.6–5.1)
Alkaline phosphatase (APISO): 105 U/L (ref 37–153)
BUN/Creatinine Ratio: 17 (calc) (ref 6–22)
BUN: 8 mg/dL (ref 7–25)
CO2: 24 mmol/L (ref 20–32)
Calcium: 9.8 mg/dL (ref 8.6–10.4)
Chloride: 103 mmol/L (ref 98–110)
Creat: 0.48 mg/dL — ABNORMAL LOW (ref 0.50–1.03)
Globulin: 3.3 g/dL (calc) (ref 1.9–3.7)
Glucose, Bld: 60 mg/dL — ABNORMAL LOW (ref 65–99)
Potassium: 4 mmol/L (ref 3.5–5.3)
Sodium: 140 mmol/L (ref 135–146)
Total Bilirubin: 0.3 mg/dL (ref 0.2–1.2)
Total Protein: 7.5 g/dL (ref 6.1–8.1)
eGFR: 112 mL/min/{1.73_m2} (ref >=60)

## 2021-09-11 LAB — VITAMIN D 25 HYDROXY (VIT D DEFICIENCY, FRACTURES): Vit D, 25-Hydroxy: 21 ng/mL — ABNORMAL LOW (ref 30–100)

## 2021-09-11 LAB — CBC
HCT: 36.9 % (ref 35.0–45.0)
Hemoglobin: 10.4 g/dL — ABNORMAL LOW (ref 11.7–15.5)
MCH: 19 pg — ABNORMAL LOW (ref 27.0–33.0)
MCHC: 28.2 g/dL — ABNORMAL LOW (ref 32.0–36.0)
MCV: 67.6 fL — ABNORMAL LOW (ref 80.0–100.0)
MPV: 9.4 fL (ref 7.5–12.5)
Platelets: 418 10*3/uL — ABNORMAL HIGH (ref 140–400)
RBC: 5.46 10*6/uL — ABNORMAL HIGH (ref 3.80–5.10)
RDW: 21.8 % — ABNORMAL HIGH (ref 11.0–15.0)
WBC: 9.8 10*3/uL (ref 3.8–10.8)

## 2021-09-11 LAB — LIPID PANEL
Cholesterol: 217 mg/dL — ABNORMAL HIGH (ref <200)
HDL: 40 mg/dL — ABNORMAL LOW (ref >=50)
LDL Cholesterol (Calc): 154 mg/dL (calc) — ABNORMAL HIGH
Non-HDL Cholesterol (Calc): 177 mg/dL (calc) — ABNORMAL HIGH (ref <130)
Total CHOL/HDL Ratio: 5.4 (calc) — ABNORMAL HIGH (ref <5.0)
Triglycerides: 110 mg/dL (ref <150)

## 2021-09-11 LAB — TSH: TSH: 0.78 mIU/L

## 2021-11-29 ENCOUNTER — Ambulatory Visit (HOSPITAL_COMMUNITY)
Admission: EM | Admit: 2021-11-29 | Discharge: 2021-11-29 | Disposition: A | Discharge status: Home / Self Care | Payer: BLUE CROSS/BLUE SHIELD | Attending: Internal Medicine | Admitting: Internal Medicine

## 2021-11-29 ENCOUNTER — Encounter (HOSPITAL_COMMUNITY): Payer: Self-pay | Admitting: Emergency Medicine

## 2021-11-29 ENCOUNTER — Other Ambulatory Visit: Payer: Self-pay

## 2021-11-29 DIAGNOSIS — E119 Type 2 diabetes mellitus without complications: Secondary | ICD-10-CM | POA: Insufficient documentation

## 2021-11-29 DIAGNOSIS — I10 Essential (primary) hypertension: Secondary | ICD-10-CM | POA: Diagnosis not present

## 2021-11-29 DIAGNOSIS — Z1152 Encounter for screening for COVID-19: Secondary | ICD-10-CM | POA: Insufficient documentation

## 2021-11-29 DIAGNOSIS — J209 Acute bronchitis, unspecified: Secondary | ICD-10-CM

## 2021-11-29 DIAGNOSIS — Z7984 Long term (current) use of oral hypoglycemic drugs: Secondary | ICD-10-CM | POA: Insufficient documentation

## 2021-11-29 DIAGNOSIS — Z79899 Other long term (current) drug therapy: Secondary | ICD-10-CM | POA: Diagnosis not present

## 2021-11-29 DIAGNOSIS — R051 Acute cough: Secondary | ICD-10-CM | POA: Diagnosis not present

## 2021-11-29 DIAGNOSIS — R0602 Shortness of breath: Secondary | ICD-10-CM

## 2021-11-29 DIAGNOSIS — F17210 Nicotine dependence, cigarettes, uncomplicated: Secondary | ICD-10-CM | POA: Insufficient documentation

## 2021-11-29 MED ORDER — PREDNISONE 20 MG PO TABS: 20.0000 mg | ORAL_TABLET | Freq: Every day | ORAL | 0 refills | Status: AC

## 2021-11-29 MED ORDER — BENZONATATE 100 MG PO CAPS: 100.0000 mg | ORAL_CAPSULE | Freq: Three times a day (TID) | ORAL | 0 refills | Status: DC

## 2021-11-29 MED ORDER — ALBUTEROL SULFATE HFA 108 (90 BASE) MCG/ACT IN AERS
2.0000 | INHALATION_SPRAY | Freq: Once | RESPIRATORY_TRACT | Status: AC
  Administered 2021-11-29: 2 via RESPIRATORY_TRACT

## 2021-11-29 MED ORDER — ALBUTEROL SULFATE HFA 108 (90 BASE) MCG/ACT IN AERS
INHALATION_SPRAY | RESPIRATORY_TRACT | Status: AC
  Filled 2021-11-29: qty 6.7

## 2021-11-29 MED ORDER — PROMETHAZINE-DM 6.25-15 MG/5ML PO SYRP: 5.0000 mL | ORAL_SOLUTION | Freq: Every evening | ORAL | 0 refills | Status: DC | PRN

## 2021-11-29 MED ORDER — ALBUTEROL SULFATE HFA 108 (90 BASE) MCG/ACT IN AERS: 1.0000 | INHALATION_SPRAY | Freq: Four times a day (QID) | RESPIRATORY_TRACT | 0 refills | Status: DC | PRN

## 2021-11-29 MED ORDER — PREDNISONE 20 MG PO TABS
20.0000 mg | ORAL_TABLET | Freq: Once | ORAL | Status: AC
  Administered 2021-11-29: 20 mg via ORAL

## 2021-11-29 MED ORDER — PREDNISONE 20 MG PO TABS
ORAL_TABLET | ORAL | Status: AC
  Filled 2021-11-29: qty 1

## 2021-11-29 NOTE — ED Triage Notes (Signed)
Sob, sore throat, fatigue started yesterday.  Patient also had hot and cold chills.  Reports husband is not feeling well either

## 2021-11-29 NOTE — Discharge Instructions (Addendum)
You have a viral illness called bronchitis causing wheeze, shortness of breath, and cough.  Use albuterol inhaler every 4-6 hours as needed for cough, shortness of breath, and wheeze. Rinse out your mouth after using this to prevent oral infection called thrush.   Take prednisone 20mg  once daily for the next 5 days. Your next dose may be tomorrow morning since you were given a dose in the clinic.   Do not take ibuprofen while taking this medicine due to increased risk of GI bleeding.  Take tessalon pearles every 8 hours as needed for cough. Take promethazine DM at bedtime for cough as needed. Do not take this and drive or drink alcohol as this can make you sleepy.  Your blood sugars may increase while taking prednisone.  Be aware of this and continue to monitor your blood sugars at home.  You may purchase Mucinex over-the-counter (plain guaifenesin) and take this twice daily to thin mucus and help with cough.  If you develop any new or worsening symptoms or do not improve in the next 2 to 3 days, please return.  If your symptoms are severe, please go to the emergency room.  Follow-up with your primary care provider for further evaluation and management of your symptoms as well as ongoing wellness visits.  I hope you feel better!

## 2021-11-29 NOTE — ED Provider Notes (Signed)
[facility]    CSN: [id number] Arrival date & time: 11/29/21  1129      History   Chief Complaint Chief Complaint  Patient presents with   Shortness of Breath    HPI Lynn Compton is a 56 y.o. female.   Patient presents urgent care for evaluation of cough, shortness of breath, nasal congestion, and chills that started yesterday.  Her husband is sick with similar symptoms at home.  She has also had a sore throat that is worse with swallowing and coughing.  No body aches, documented fever at home, ear pain, neck pain, chest pain, weakness, significant fatigue, or abdominal pain.  No nausea, vomiting, or diarrhea.  Patient is a current every day cigarette smoker but denies history of allergies/asthma.  She is vaccinated against COVID-19 but has not received her yearly flu vaccine yet.  Cough is worse at nighttime and has not responded well to over-the-counter cold and flu medications prior to arrival urgent care.   Shortness of Breath   Past Medical History:  Diagnosis Date   Diabetes mellitus without complication (HCC)    Hypertension     There are no problems to display for this patient.   History reviewed. No pertinent surgical history.  OB History   No obstetric history on file.      Home Medications    Prior to Admission medications   Medication Sig Start Date End Date Taking? Authorizing Provider  albuterol (VENTOLIN HFA) 108 (90 Base) MCG/ACT inhaler Inhale 1-2 puffs into the lungs every 6 (six) hours as needed for wheezing or shortness of breath. 11/29/21  Yes Carlisle Beers, FNP  benzonatate (TESSALON) 100 MG capsule Take 1 capsule (100 mg total) by mouth every 8 (eight) hours. 11/29/21  Yes Carlisle Beers, FNP  predniSONE (DELTASONE) 20 MG tablet Take 1 tablet (20 mg total) by mouth daily for 4 days. 11/29/21 12/03/21 Yes Carlisle Beers, FNP  azithromycin (ZITHROMAX) 250 MG tablet Take 1 tablet (250 mg total) by mouth daily. Take  first 2 tablets together, then 1 every day until finished. Patient not taking: Reported on 11/29/2021 04/21/21   Meccariello, Solmon Ice, MD  glimepiride (AMARYL) 4 MG tablet Take 4 mg by mouth 2 (two) times daily. 09/06/20   [provider]  hydrochlorothiazide (HYDRODIURIL) 25 MG tablet Take 1 tablet (25 mg total) by mouth daily. 02/24/16   Joy, Shawn C, PA-C  ibuprofen (ADVIL,MOTRIN) 600 MG tablet Take 600 mg by mouth daily as needed. 01/06/16   [provider]  metFORMIN (GLUCOPHAGE) 500 MG tablet Take 500 mg by mouth daily. 02/21/16   [provider]  ondansetron (ZOFRAN-ODT) 4 MG disintegrating tablet Take 1 tablet (4 mg total) by mouth every 8 (eight) hours as needed for nausea or vomiting. 04/01/21   Merrilee Jansky, MD  promethazine-dextromethorphan (PROMETHAZINE-DM) 6.25-15 MG/5ML syrup Take 5 mLs by mouth at bedtime as needed for cough. 11/29/21   Carlisle Beers, FNP  Vitamin D, Ergocalciferol, (DRISDOL) 50000 units CAPS capsule Take 50,000 Units by mouth once a week. 11/26/15   [provider]    Family History Family History  Problem Relation Age of Onset   Diabetes Mother    CAD Father     Social History Social History   Tobacco Use   Smoking status: Every Day    Types: Cigarettes   Smokeless tobacco: Never  Vaping Use   Vaping Use: Never used  Substance Use Topics   Alcohol  use: Yes    Comment: occ   Drug use: No     Allergies   Patient has no known allergies.   Review of Systems Review of Systems  Respiratory:  Positive for shortness of breath.   Per HPI   Physical Exam Triage Vital Signs ED Triage Vitals  Enc Vitals Group     BP 11/29/21 1347 133/73     Pulse Rate 11/29/21 1347 85     Resp 11/29/21 1347 20     Temp 11/29/21 1347 98.7 F (37.1 C)     Temp Source 11/29/21 1347 Oral     SpO2 11/29/21 1347 96 %     Weight --      Height --      Head Circumference --      Peak Flow --      Pain Score 11/29/21  1344 5     Pain Loc --      Pain Edu? --      Excl. in GC? --    No data found.  Updated Vital Signs BP 133/73 (BP Location: Right Arm)   Pulse 85   Temp 98.7 F (37.1 C) (Oral)   Resp 20   SpO2 96%   Visual Acuity Right Eye Distance:   Left Eye Distance:   Bilateral Distance:    Right Eye Near:   Left Eye Near:    Bilateral Near:     Physical Exam Vitals and nursing note reviewed.  Constitutional:      Appearance: She is not ill-appearing or toxic-appearing.  HENT:     Head: Normocephalic and atraumatic.     Right Ear: Hearing, tympanic membrane, ear canal and external ear normal.     Left Ear: Hearing, tympanic membrane, ear canal and external ear normal.     Nose: Congestion present.     Mouth/Throat:     Lips: Pink.     Mouth: Mucous membranes are moist.     Pharynx: No posterior oropharyngeal erythema.     Comments: Small amount of clear postnasal drainage visualized to the posterior oropharynx.  Eyes:     General: Lids are normal. Vision grossly intact. Gaze aligned appropriately.     Extraocular Movements: Extraocular movements intact.     Conjunctiva/sclera: Conjunctivae normal.  Cardiovascular:     Rate and Rhythm: Normal rate and regular rhythm.     Heart sounds: Normal heart sounds, S1 normal and S2 normal.  Pulmonary:     Effort: Pulmonary effort is normal. No accessory muscle usage, prolonged expiration, respiratory distress or retractions.     Breath sounds: Normal air entry. No decreased air movement. Wheezing present. No decreased breath sounds, rhonchi or rales.     Comments: Faint expiratory wheezes heard to all lung fields to auscultation.  No acute respiratory distress.  Musculoskeletal:     Cervical back: Neck supple.  Skin:    General: Skin is warm and dry.     Capillary Refill: Capillary refill takes less than 2 seconds.     Findings: No rash.  Neurological:     General: No focal deficit present.     Mental Status: She is alert and  oriented to person, place, and time. Mental status is at baseline.     Cranial Nerves: No dysarthria or facial asymmetry.  Psychiatric:        Mood and Affect: Mood normal.        Speech: Speech normal.  Behavior: Behavior normal.        Thought Content: Thought content normal.        Judgment: Judgment normal.      UC Treatments / Results  Labs (all labs ordered are listed, but only abnormal results are displayed) Labs Reviewed  SARS CORONAVIRUS 2 (TAT 6-24 HRS)    EKG   Radiology No results found.  Procedures Procedures (including critical care time)  Medications Ordered in UC Medications  albuterol (VENTOLIN HFA) 108 (90 Base) MCG/ACT inhaler 2 puff (2 puffs Inhalation Given 11/29/21 1440)  predniSONE (DELTASONE) tablet 20 mg (20 mg Oral Given 11/29/21 1440)    Initial Impression / Assessment and Plan / UC Course  I have reviewed the triage vital signs and the nursing notes.  Pertinent labs & imaging results that were available during my care of the patient were reviewed by me and considered in my medical decision making (see chart for details).   1.  Acute bronchitis Symptoms and physical exam are consistent with acute bronchitis etiology.  Patient given albuterol inhaler 2 puffs in clinic and advised to use this every 4-6 hours as needed for cough, shortness of breath, and wheeze at home.  First dose of prednisone burst given in clinic 20 mg.  Patient is diabetic and therefore we will reduce the dose of the prednisone burst.  Patient to take 20 mg prednisone once daily for the next 4 days starting tomorrow with food.  Recommend avoiding ibuprofen while taking this due to increased risk of GI bleeding. Patient is a diabetic and has been advised that her sugars may increase while taking steroid.  She checks her sugars on a regular basis and diabetes is well controlled at this time with home medications prescribed by PCP.  Deferred imaging based on stable  cardiopulmonary exam and hemodynamically stable vital signs in clinic.  She is nontoxic-appearing and does not appear to be clinically dehydrated.  COVID-19 testing is pending and will come back in the next 24 hours.  She is a candidate for antiviral therapy should this come back positive.  Quarantine guidelines regarding COVID-19 discussed.  She expresses understanding.  Tessalon Perles every 8 hours as needed for cough.  Promethazine DM at bedtime as needed for cough prescribed.  Drowsiness precautions regarding Promethazine DM discussed.  May purchase Mucinex over-the-counter to take this twice daily to thin mucus.  Discussed physical exam and available lab work findings in clinic with patient.  Counseled patient regarding appropriate use of medications and potential side effects for all medications recommended or prescribed today. Discussed red flag signs and symptoms of worsening condition,when to call the PCP office, return to urgent care, and when to seek higher level of care in the emergency department. Patient verbalizes understanding and agreement with plan. All questions answered. Patient discharged in stable condition.    Final Clinical Impressions(s) / UC Diagnoses   Final diagnoses:  Acute bronchitis, unspecified organism  Acute cough  Shortness of breath     Discharge Instructions      You have a viral illness called bronchitis causing wheeze, shortness of breath, and cough.  Use albuterol inhaler every 4-6 hours as needed for cough, shortness of breath, and wheeze. Rinse out your mouth after using this to prevent oral infection called thrush.   Take prednisone 20mg  once daily for the next 5 days. Your next dose may be tomorrow morning since you were given a dose in the clinic.   Do not take ibuprofen while taking  this medicine due to increased risk of GI bleeding.  Take tessalon pearles every 8 hours as needed for cough. Take promethazine DM at bedtime for cough as  needed. Do not take this and drive or drink alcohol as this can make you sleepy.  Your blood sugars may increase while taking prednisone.  Be aware of this and continue to monitor your blood sugars at home.  You may purchase Mucinex over-the-counter (plain guaifenesin) and take this twice daily to thin mucus and help with cough.  If you develop any new or worsening symptoms or do not improve in the next 2 to 3 days, please return.  If your symptoms are severe, please go to the emergency room.  Follow-up with your primary care provider for further evaluation and management of your symptoms as well as ongoing wellness visits.  I hope you feel better!    ED Prescriptions     Medication Sig Dispense Auth. Provider   predniSONE (DELTASONE) 20 MG tablet Take 1 tablet (20 mg total) by mouth daily for 4 days. 4 tablet Carlisle Beers, FNP   albuterol (VENTOLIN HFA) 108 (90 Base) MCG/ACT inhaler Inhale 1-2 puffs into the lungs every 6 (six) hours as needed for wheezing or shortness of breath. 18 g Reita May M, FNP   promethazine-dextromethorphan (PROMETHAZINE-DM) 6.25-15 MG/5ML syrup Take 5 mLs by mouth at bedtime as needed for cough. 118 mL Reita May M, FNP   benzonatate (TESSALON) 100 MG capsule Take 1 capsule (100 mg total) by mouth every 8 (eight) hours. 21 capsule Carlisle Beers, FNP      PDMP not reviewed this encounter.   Carlisle Beers, [place] 11/29/21 [phone]

## 2021-11-30 LAB — SARS CORONAVIRUS 2 (TAT 6-24 HRS): SARS Coronavirus 2: NEGATIVE

## 2022-03-07 ENCOUNTER — Other Ambulatory Visit: Payer: Self-pay

## 2022-03-07 ENCOUNTER — Encounter (HOSPITAL_COMMUNITY): Payer: Self-pay | Admitting: *Deleted

## 2022-03-07 ENCOUNTER — Ambulatory Visit (HOSPITAL_COMMUNITY)
Admission: EM | Admit: 2022-03-07 | Discharge: 2022-03-07 | Disposition: A | Discharge status: Home / Self Care | Payer: BLUE CROSS/BLUE SHIELD | Attending: Internal Medicine | Admitting: Internal Medicine

## 2022-03-07 DIAGNOSIS — R197 Diarrhea, unspecified: Secondary | ICD-10-CM | POA: Diagnosis present

## 2022-03-07 DIAGNOSIS — Z20822 Contact with and (suspected) exposure to covid-19: Secondary | ICD-10-CM | POA: Diagnosis not present

## 2022-03-07 DIAGNOSIS — J029 Acute pharyngitis, unspecified: Secondary | ICD-10-CM | POA: Diagnosis not present

## 2022-03-07 DIAGNOSIS — R599 Enlarged lymph nodes, unspecified: Secondary | ICD-10-CM | POA: Insufficient documentation

## 2022-03-07 DIAGNOSIS — B349 Viral infection, unspecified: Secondary | ICD-10-CM | POA: Insufficient documentation

## 2022-03-07 LAB — POCT RAPID STREP A, ED / UC: Streptococcus, Group A Screen (Direct): NEGATIVE

## 2022-03-07 MED ORDER — PREDNISONE 20 MG PO TABS: 40.0000 mg | ORAL_TABLET | Freq: Every day | ORAL | 0 refills | Status: AC

## 2022-03-07 NOTE — ED Triage Notes (Signed)
Pt reports since [facility] having diarrhea ,scratchy throat and swelling to face.

## 2022-03-07 NOTE — ED Provider Notes (Addendum)
[place]    CSN: [id number] Arrival date & time: 03/07/22  [id number]      History   Chief Complaint Chief Complaint  Patient presents with   Diarrhea   Sore Throat   Facial Swelling    HPI Lynn Compton is a 57 y.o. female.   Patient presents with diarrhea, sore throat, nasal congestion, right-sided facial swelling that started yesterday.  Patient denies cough, ear pain, nausea, vomiting, abdominal pain.  Denies blood in stool.  Denies any associated fever or known sick contacts.  Patient has been able to tolerate fluids.  Denies chest pain or shortness of breath.   Diarrhea Sore Throat    Past Medical History:  Diagnosis Date   Diabetes mellitus without complication ([place])    Hypertension     There are no problems to display for this patient.   History reviewed. No pertinent surgical history.  OB History   No obstetric history on file.      Home Medications    Prior to Admission medications   Medication Sig Start Date End Date Taking? Authorizing Provider  predniSONE (DELTASONE) 20 MG tablet Take 2 tablets (40 mg total) by mouth daily for 5 days. 03/07/22 03/12/22 Yes Kahleel Fadeley, Michele Rockers, FNP  albuterol (VENTOLIN HFA) 108 (90 Base) MCG/ACT inhaler Inhale 1-2 puffs into the lungs every 6 (six) hours as needed for wheezing or shortness of breath. 11/29/21   Talbot Grumbling, FNP  azithromycin (ZITHROMAX) 250 MG tablet Take 1 tablet (250 mg total) by mouth daily. Take first 2 tablets together, then 1 every day until finished. Patient not taking: Reported on 11/29/2021 04/21/21   Meccariello, Bernita Raisin, MD  benzonatate (TESSALON) 100 MG capsule Take 1 capsule (100 mg total) by mouth every 8 (eight) hours. 11/29/21   Talbot Grumbling, FNP  glimepiride (AMARYL) 4 MG tablet Take 4 mg by mouth 2 (two) times daily. 09/06/20   [provider]  hydrochlorothiazide (HYDRODIURIL) 25 MG tablet Take 1 tablet (25 mg total) by mouth daily. 02/24/16   Joy, Shawn  C, PA-C  ibuprofen (ADVIL,MOTRIN) 600 MG tablet Take 600 mg by mouth daily as needed. 01/06/16   [provider]  metFORMIN (GLUCOPHAGE) 500 MG tablet Take 500 mg by mouth daily. 02/21/16   [provider]  ondansetron (ZOFRAN-ODT) 4 MG disintegrating tablet Take 1 tablet (4 mg total) by mouth every 8 (eight) hours as needed for nausea or vomiting. 04/01/21   Chase Picket, MD  promethazine-dextromethorphan (PROMETHAZINE-DM) 6.25-15 MG/5ML syrup Take 5 mLs by mouth at bedtime as needed for cough. 11/29/21   Talbot Grumbling, FNP  Vitamin D, Ergocalciferol, (DRISDOL) 50000 units CAPS capsule Take 50,000 Units by mouth once a week. 11/26/15   [provider]    Family History Family History  Problem Relation Age of Onset   Diabetes Mother    CAD Father     Social History Social History   Tobacco Use   Smoking status: Every Day    Types: Cigarettes   Smokeless tobacco: Never  Vaping Use   Vaping Use: Never used  Substance Use Topics   Alcohol use: Yes    Comment: occ   Drug use: No     Allergies   Patient has no known allergies.   Review of Systems Review of Systems Per HPI  Physical Exam Triage Vital Signs ED Triage Vitals  Enc Vitals Group     BP 03/07/22 0831 115/63  Pulse Rate 03/07/22 0831 (!) 105     Resp 03/07/22 0831 18     Temp 03/07/22 0831 98.9 F (37.2 C)     Temp src --      SpO2 03/07/22 0831 95 %     Weight --      Height --      Head Circumference --      Peak Flow --      Pain Score 03/07/22 0829 0     Pain Loc --      Pain Edu? --      Excl. in [address]? --    No data found.  Updated Vital Signs BP 115/63   Pulse (!) 105   Temp 98.9 F (37.2 C)   Resp 18   SpO2 95%   Visual Acuity Right Eye Distance:   Left Eye Distance:   Bilateral Distance:    Right Eye Near:   Left Eye Near:    Bilateral Near:     Physical Exam Constitutional:      General: She is not in acute distress.    Appearance: Normal  appearance. She is not toxic-appearing or diaphoretic.  HENT:     Head: Normocephalic and atraumatic.     Right Ear: Tympanic membrane and ear canal normal.     Left Ear: Tympanic membrane and ear canal normal.     Nose: Congestion present.     Mouth/Throat:     Mouth: Mucous membranes are moist.     Pharynx: No posterior oropharyngeal erythema.     Comments: No dental infections.  No erythema or signs of infection inside mouth. Eyes:     Extraocular Movements: Extraocular movements intact.     Conjunctiva/sclera: Conjunctivae normal.     Pupils: Pupils are equal, round, and reactive to light.  Cardiovascular:     Rate and Rhythm: Normal rate and regular rhythm.     Pulses: Normal pulses.     Heart sounds: Normal heart sounds.  Pulmonary:     Effort: Pulmonary effort is normal. No respiratory distress.     Breath sounds: Normal breath sounds. No stridor. No wheezing, rhonchi or rales.  Abdominal:     General: Abdomen is flat. Bowel sounds are normal. There is no distension.     Palpations: Abdomen is soft.     Tenderness: There is no abdominal tenderness.  Musculoskeletal:        General: Normal range of motion.     Cervical back: Normal range of motion.  Lymphadenopathy:     Head:     Right side of head: Preauricular adenopathy present.     Comments: Mild preauricular lymph node swelling.  Skin:    General: Skin is warm and dry.  Neurological:     General: No focal deficit present.     Mental Status: She is alert and oriented to person, place, and time. Mental status is at baseline.  Psychiatric:        Mood and Affect: Mood normal.        Behavior: Behavior normal.      UC Treatments / Results  Labs (all labs ordered are listed, but only abnormal results are displayed) Labs Reviewed  CULTURE, GROUP A STREP ([place])  SARS CORONAVIRUS 2 (TAT 6-24 HRS)  POCT RAPID STREP A, ED / UC    EKG   Radiology No results found.  Procedures Procedures (including critical  care time)  Medications Ordered in UC Medications - No data to  display  Initial Impression / Assessment and Plan / UC Course  I have reviewed the triage vital signs and the nursing notes.  Pertinent labs & imaging results that were available during my care of the patient were reviewed by me and considered in my medical decision making (see chart for details).     Patient's symptoms appear viral in etiology.  Patient's facial swelling appears to be consistent with lymph node swelling.  There is no signs of airway compromise or significant swelling so I do not think an emergent evaluation is necessary.  I do think patient would benefit from prednisone to decrease inflammation so this was prescribed for patient.  She has taken this before and tolerated well.  Advised her to monitor blood sugars very closely while on prednisone.  She voiced understanding of this.  There are no signs of acute abdomen or dehydration on exam either so do not think that imaging of the abdomen is necessary.  Advised adequate fluid hydration and bland diet for diarrhea.  COVID test is pending.  Advised supportive care and symptom management regarding symptoms with patient.  Discussed strict return and ER precautions.  Patient advised to go to the ER or follow-up if facial swelling persists or worsens.  Heart rate appears baseline so no further workup for this is necessary.  Patient verbalized understanding and was agreeable with plan. Final Clinical Impressions(s) / UC Diagnoses   Final diagnoses:  Sore throat  Diarrhea, unspecified type  Lymph nodes enlarged  Viral illness  Encounter for laboratory testing for COVID-19 virus     Discharge Instructions      Rapid strep is negative.  COVID test pending.  Will call if it is positive.  It appears that you have a viral illness causing your symptoms.  Your facial swelling is due to lymph node swelling.  I have prescribed prednisone to help with lymph node swelling.  Take  this with food.  Monitor blood sugars very closely while taking prednisone.  Follow-up if any symptoms persist or worsen.    ED Prescriptions     Medication Sig Dispense Auth. Provider   predniSONE (DELTASONE) 20 MG tablet Take 2 tablets (40 mg total) by mouth daily for 5 days. 10 tablet Teodora Medici, [place]      PDMP not reviewed this encounter.   Teodora Medici, [place] 03/07/22 1407    Teodora Medici, [place] 03/07/22 1407

## 2022-03-07 NOTE — Discharge Instructions (Addendum)
Rapid strep is negative.  COVID test pending.  Will call if it is positive.  It appears that you have a viral illness causing your symptoms.  Your facial swelling is due to lymph node swelling.  I have prescribed prednisone to help with lymph node swelling.  Take this with food.  Monitor blood sugars very closely while taking prednisone.  Follow-up if any symptoms persist or worsen.

## 2022-03-08 LAB — SARS CORONAVIRUS 2 (TAT 6-24 HRS): SARS Coronavirus 2: NEGATIVE

## 2022-03-09 LAB — CULTURE, GROUP A STREP (THRC)

## 2022-10-17 ENCOUNTER — Other Ambulatory Visit: Payer: Self-pay | Admitting: Family

## 2022-10-17 DIAGNOSIS — Z1231 Encounter for screening mammogram for malignant neoplasm of breast: Secondary | ICD-10-CM

## 2022-10-21 ENCOUNTER — Ambulatory Visit
Admission: RE | Admit: 2022-10-21 | Discharge: 2022-10-21 | Disposition: A | Discharge status: Home / Self Care | Payer: BLUE CROSS/BLUE SHIELD | Source: Ambulatory Visit | Attending: Family | Admitting: Family

## 2022-10-21 DIAGNOSIS — Z1231 Encounter for screening mammogram for malignant neoplasm of breast: Secondary | ICD-10-CM

## 2022-10-25 ENCOUNTER — Other Ambulatory Visit: Payer: Self-pay | Admitting: Family

## 2022-10-25 DIAGNOSIS — R928 Other abnormal and inconclusive findings on diagnostic imaging of breast: Secondary | ICD-10-CM

## 2022-10-31 IMAGING — DX DG CHEST 2V
2 series · 2 of 2 positions shown · non-contrast
Comparison: 06/22/2017 and CT chest 08/17/2010.

CLINICAL DATA: Cough and wheezing for 1 week.  Sore throat.

EXAM:
CHEST - 2 VIEW

[chest pa]
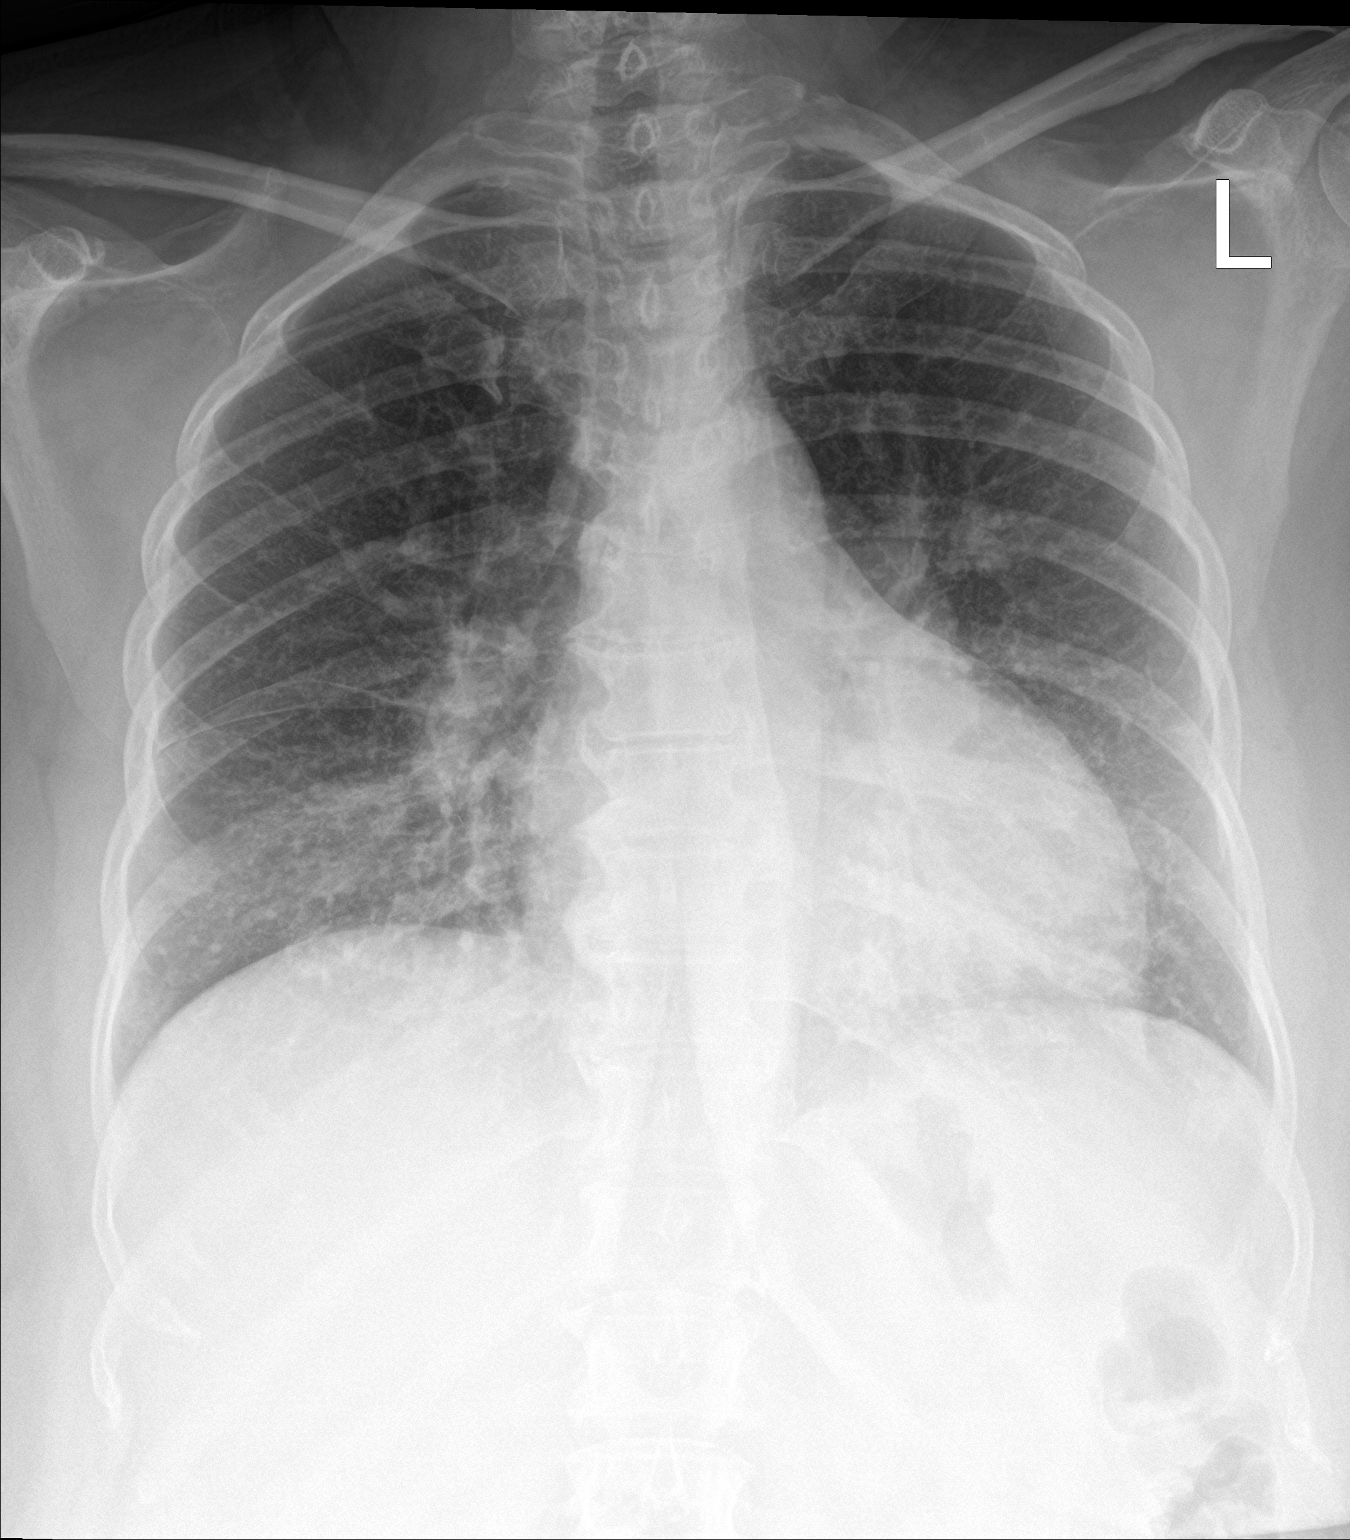

[chest lat]
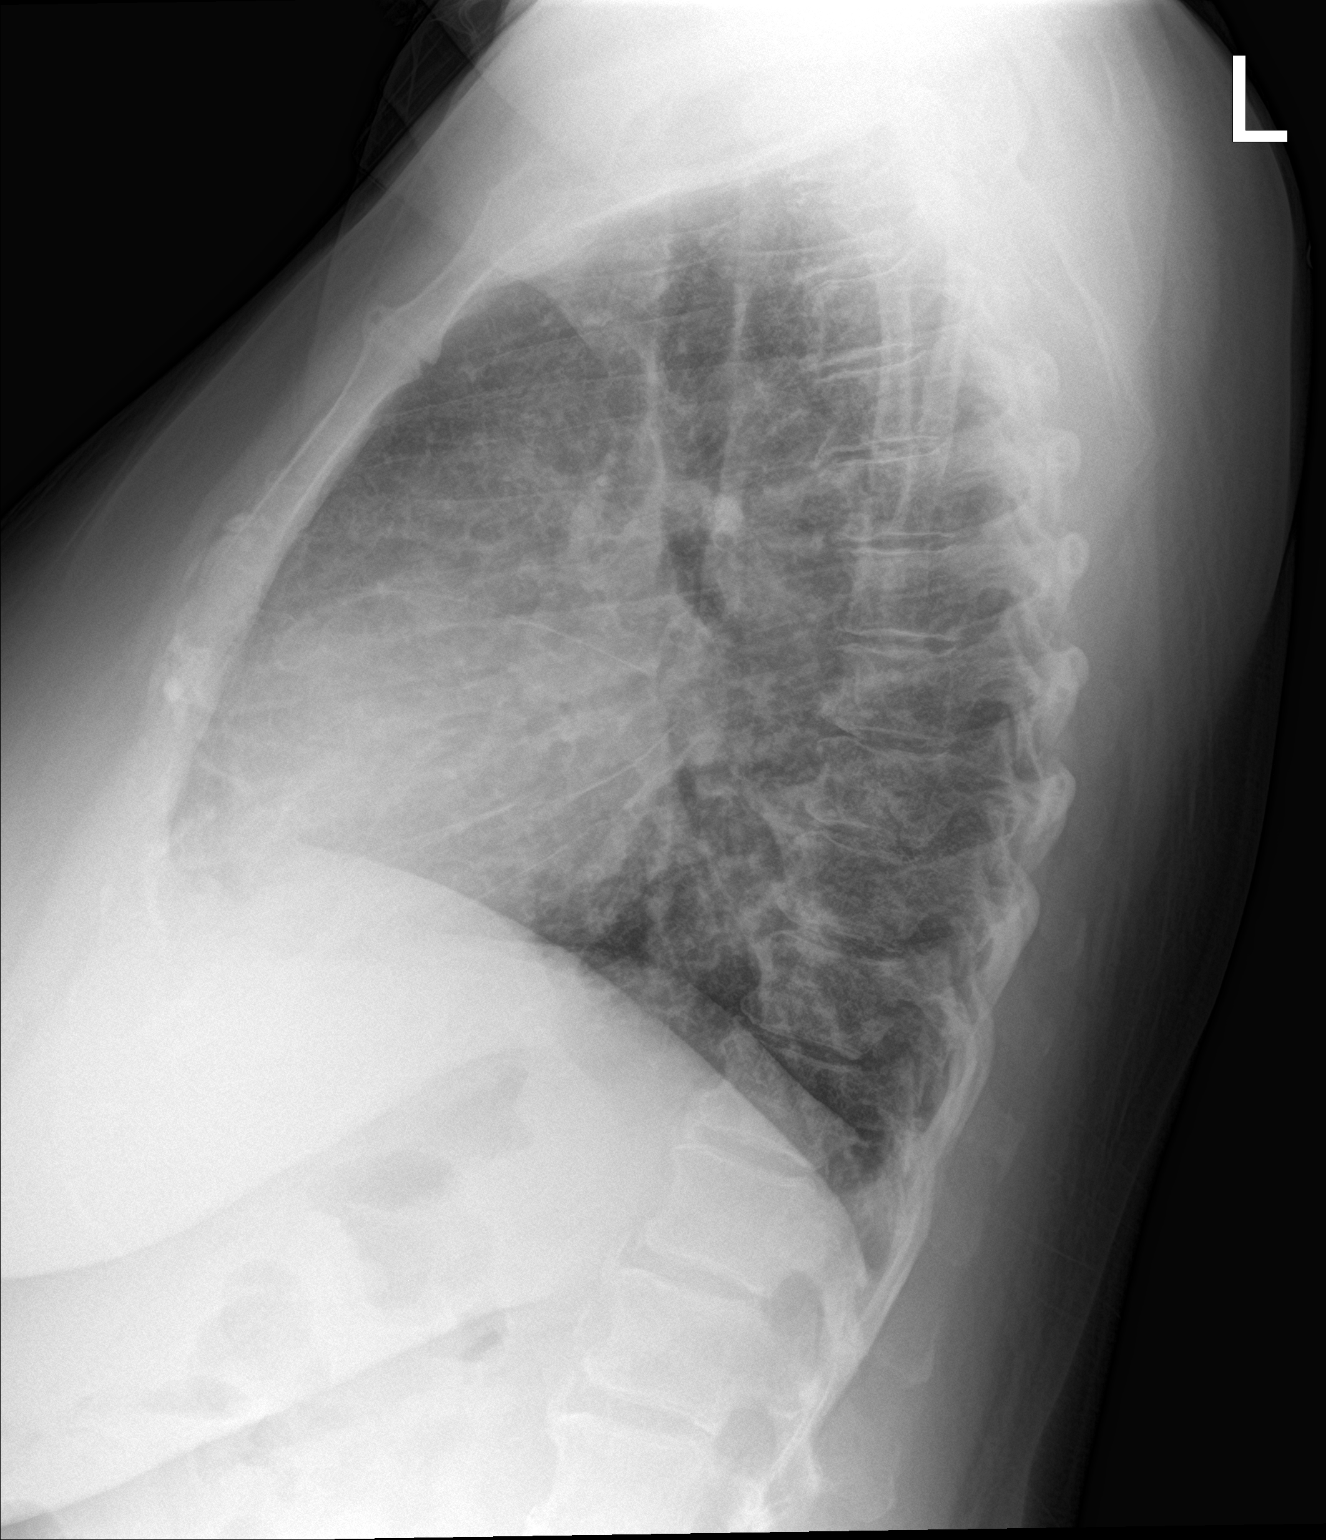

[2 of 2 positions shown; findings below may reference images not displayed]

FINDINGS: Trachea is midline. Heart is enlarged. Mild central and lower lung
zone predominant interstitial prominence and indistinctness. No
dense airspace consolidation. No pleural fluid.
IMPRESSION: Mild central and lower lung zone predominant interstitial prominence
and indistinctness, findings suggesting a viral or atypical
pneumonia.

## 2022-11-02 ENCOUNTER — Ambulatory Visit
Admission: RE | Admit: 2022-11-02 | Discharge: 2022-11-02 | Disposition: A | Discharge status: Home / Self Care | Payer: BLUE CROSS/BLUE SHIELD | Source: Ambulatory Visit | Attending: Family | Admitting: Family

## 2022-11-02 ENCOUNTER — Ambulatory Visit: Admission: RE | Admit: 2022-11-02 | Payer: BLUE CROSS/BLUE SHIELD | Source: Ambulatory Visit

## 2022-11-02 DIAGNOSIS — R928 Other abnormal and inconclusive findings on diagnostic imaging of breast: Secondary | ICD-10-CM

## 2022-11-22 ENCOUNTER — Ambulatory Visit (HOSPITAL_COMMUNITY)
Admission: RE | Admit: 2022-11-22 | Discharge: 2022-11-22 | Disposition: A | Discharge status: Home / Self Care | Payer: BLUE CROSS/BLUE SHIELD | Source: Ambulatory Visit

## 2022-11-22 ENCOUNTER — Encounter (HOSPITAL_COMMUNITY): Payer: Self-pay

## 2022-11-22 VITALS — BP 111/69 | HR 62 | Temp 98.3°F | Resp 18

## 2022-11-22 DIAGNOSIS — H5711 Ocular pain, right eye: Secondary | ICD-10-CM

## 2022-11-22 MED ORDER — IBUPROFEN 600 MG PO TABS: 600.0000 mg | ORAL_TABLET | Freq: Four times a day (QID) | ORAL | 0 refills | Status: DC | PRN

## 2022-11-22 MED ORDER — OLOPATADINE HCL 0.1 % OP SOLN: 1.0000 [drp] | Freq: Two times a day (BID) | OPHTHALMIC | 0 refills | Status: DC

## 2022-11-22 NOTE — Discharge Instructions (Signed)
Ibuprofen - 600 mg every 6 hours as needed for pain, inflammation, discomfort  Olopatadine - eye drops used twice daily for the next week or so for comfort  Please follow up with eye specialist if persisting

## 2022-11-22 NOTE — ED Triage Notes (Signed)
Pt c/o right eye pain that started Sunday. States the pain is behind her eye and feels like its throbbing. Denies any injury or object getting in eye.

## 2022-11-22 NOTE — ED Provider Notes (Signed)
[facility]    CSN: [id number] Arrival date & time: 11/22/22  1043     History   Chief Complaint Chief Complaint  Patient presents with   Eye Problem    Entered by patient    HPI Lynn Compton is a 57 y.o. female.  2 day history of right eye discomfort  Rating 2/10. Feels in the lateral eye/lid Denies vision loss or changes. Wears glasses. No injury or trauma. Denies foreign body sensation No discharge or drainage. No pain with EOM Denies fever  Hx DM and HTN, well controlled. Has a PCP  Past Medical History:  Diagnosis Date   Diabetes mellitus without complication (HCC)    Hypertension     There are no problems to display for this patient.   History reviewed. No pertinent surgical history.  OB History   No obstetric history on file.      Home Medications    Prior to Admission medications   Medication Sig Start Date End Date Taking? Authorizing Provider  ibuprofen (ADVIL) 600 MG tablet Take 1 tablet (600 mg total) by mouth every 6 (six) hours as needed. 11/22/22  Yes Mickenzie Stolar, Lurena Joiner, PA-C  olopatadine (PATANOL) 0.1 % ophthalmic solution Place 1 drop into both eyes 2 (two) times daily. 11/22/22  Yes Anisa Leanos, PA-C  glimepiride (AMARYL) 4 MG tablet Take 4 mg by mouth 2 (two) times daily. 09/06/20   [provider]  hydrochlorothiazide (HYDRODIURIL) 25 MG tablet Take 1 tablet (25 mg total) by mouth daily. 02/24/16   Joy, Shawn C, PA-C  metFORMIN (GLUCOPHAGE) 500 MG tablet Take 500 mg by mouth daily. 02/21/16   [provider]  OZEMPIC, 0.25 OR 0.5 MG/DOSE, 2 MG/3ML SOPN Inject 0.25 mg into the skin once a week.    [provider]  Vitamin D, Ergocalciferol, (DRISDOL) 50000 units CAPS capsule Take 50,000 Units by mouth once a week. 11/26/15   [provider]    Family History Family History  Problem Relation Age of Onset   Diabetes Mother    CAD Father     Social History Social History   Tobacco Use    Smoking status: Every Day    Types: Cigarettes   Smokeless tobacco: Never  Vaping Use   Vaping status: Never Used  Substance Use Topics   Alcohol use: Yes    Comment: occ   Drug use: No     Allergies   Patient has no known allergies.   Review of Systems Review of Systems Per HPI  Physical Exam Triage Vital Signs ED Triage Vitals  Encounter Vitals Group     BP 11/22/22 1102 111/69     Systolic BP Percentile --      Diastolic BP Percentile --      Pulse Rate 11/22/22 1102 62     Resp 11/22/22 1102 18     Temp 11/22/22 1102 98.3 F (36.8 C)     Temp Source 11/22/22 1102 Oral     SpO2 11/22/22 1102 92 %     Weight --      Height --      Head Circumference --      Peak Flow --      Pain Score 11/22/22 1059 2     Pain Loc --      Pain Education --      Exclude from Growth Chart --    No data found.  Updated Vital Signs BP 111/69 (BP Location: Left Arm)  Pulse 62   Temp 98.3 F (36.8 C) (Oral)   Resp 18   SpO2 92%   Visual Acuity Right Eye Distance: 20/25 (wearing glasses) Left Eye Distance: 20/25 (wearing glasses) Bilateral Distance: 20/20 (wearing glasses)   Physical Exam Vitals and nursing note reviewed.  Constitutional:      General: She is not in acute distress.    Appearance: She is not ill-appearing.  Eyes:     General: Lids are normal. Lids are everted, no foreign bodies appreciated. Vision grossly intact. Gaze aligned appropriately.        Right eye: No foreign body, discharge or hordeolum.        Left eye: No discharge.     Extraocular Movements: Extraocular movements intact.     Right eye: Normal extraocular motion and no nystagmus.     Conjunctiva/sclera: Conjunctivae normal.     Right eye: Right conjunctiva is not injected. No exudate.    Pupils: Pupils are equal, round, and reactive to light.  Cardiovascular:     Rate and Rhythm: Normal rate and regular rhythm.  Pulmonary:     Effort: Pulmonary effort is normal.  Musculoskeletal:      Cervical back: Normal range of motion.  Skin:    General: Skin is warm and dry.  Neurological:     Mental Status: She is alert and oriented to person, place, and time.     UC Treatments / Results  Labs (all labs ordered are listed, but only abnormal results are displayed) Labs Reviewed - No data to display  EKG  Radiology No results found.  Procedures Procedures   Medications Ordered in UC Medications - No data to display  Initial Impression / Assessment and Plan / UC Course  I have reviewed the triage vital signs and the nursing notes.  Pertinent labs & imaging results that were available during my care of the patient were reviewed by me and considered in my medical decision making (see chart for details).  Vision intact. Normal exam. No red flags Recommend trying olopatadine eye drops BID, ibuprofen for inflammation. Follow up with eye specialist if persisting. Patient agrees to plan, no questions. Work note provided   Final Clinical Impressions(s) / UC Diagnoses   Final diagnoses:  Discomfort of right eye     Discharge Instructions      Ibuprofen - 600 mg every 6 hours as needed for pain, inflammation, discomfort  Olopatadine - eye drops used twice daily for the next week or so for comfort  Please follow up with eye specialist if persisting      ED Prescriptions     Medication Sig Dispense Auth. Provider   olopatadine (PATANOL) 0.1 % ophthalmic solution Place 1 drop into both eyes 2 (two) times daily. 5 mL Kenlynn Houde, PA-C   ibuprofen (ADVIL) 600 MG tablet Take 1 tablet (600 mg total) by mouth every 6 (six) hours as needed. 30 tablet Kristyanna Barcelo, Lurena Joiner, PA-C      PDMP not reviewed this encounter.   Saman Umstead, Lurena Joiner, PA-C 11/22/22 1140

## 2023-04-03 ENCOUNTER — Encounter (HOSPITAL_COMMUNITY): Payer: Self-pay

## 2023-04-03 ENCOUNTER — Ambulatory Visit (HOSPITAL_COMMUNITY): Admission: EM | Admit: 2023-04-03 | Discharge: 2023-04-03 | Disposition: A | Discharge status: Home / Self Care

## 2023-04-03 DIAGNOSIS — R051 Acute cough: Secondary | ICD-10-CM | POA: Diagnosis not present

## 2023-04-03 DIAGNOSIS — B9789 Other viral agents as the cause of diseases classified elsewhere: Secondary | ICD-10-CM | POA: Diagnosis not present

## 2023-04-03 DIAGNOSIS — J988 Other specified respiratory disorders: Secondary | ICD-10-CM

## 2023-04-03 LAB — POCT INFLUENZA A/B
Influenza A, POC: NEGATIVE
Influenza B, POC: NEGATIVE

## 2023-04-03 MED ORDER — BENZONATATE 100 MG PO CAPS: 100.0000 mg | ORAL_CAPSULE | Freq: Three times a day (TID) | ORAL | 0 refills | Status: DC

## 2023-04-03 MED ORDER — PROMETHAZINE-DM 6.25-15 MG/5ML PO SYRP: 5.0000 mL | ORAL_SOLUTION | Freq: Every evening | ORAL | 0 refills | Status: DC | PRN

## 2023-04-03 NOTE — ED Triage Notes (Signed)
 Patient here today with c/o cough, body aches, diarrhea, fever, chills, and runny nose since Saturday. Patient has been taking Tylenol and OTC cold medicine with no relief. No known sick contacts.

## 2023-04-03 NOTE — Discharge Instructions (Addendum)
 You tested negative for flu today.  As discussed I believe your symptoms are related to a viral illness.  I prescribed Tessalon that you can take every 8 hours as needed for cough.  I have also prescribed Promethazine DM cough syrup that you can take at night as needed for cough.  This can make you drowsy so do not drive, work, or drink alcohol while taking.  Otherwise you can alternate between Tylenol and Ibuprofen as needed for pain and fever. I recommend Mucinex for cough and congestion as needed. You can also take over-the-counter Imodium as needed for diarrhea.  Stay hydrated and get plenty of rest. Return here if symptoms persist or worsen.

## 2023-04-03 NOTE — ED Provider Notes (Signed)
 [facility]    CSN: [id number] Arrival date & time: 04/03/23  1002      History   Chief Complaint Chief Complaint  Patient presents with   Cough    HPI Lynn Compton is a 58 y.o. female.   Patients presents with cough, congestion, body aches, chills, fever, and mild diarrhea that began the evening of 3/8. Denies shortness of breath, chest pain, abdominal pain, nausea, and vomiting.   Reports taking Tylenol and OTC cold medication with minimal relief.    Cough   Past Medical History:  Diagnosis Date   Diabetes mellitus without complication (HCC)    Hypertension     There are no active problems to display for this patient.   History reviewed. No pertinent surgical history.  OB History   No obstetric history on file.      Home Medications    Prior to Admission medications   Medication Sig Start Date End Date Taking? Authorizing Provider  benzonatate (TESSALON) 100 MG capsule Take 1 capsule (100 mg total) by mouth every 8 (eight) hours. 04/03/23  Yes Susann Givens, Roselynne Lortz A, NP  naproxen (NAPROSYN) 375 MG tablet Take 375 mg by mouth 2 (two) times daily. 02/28/23  Yes [provider]  promethazine-dextromethorphan (PROMETHAZINE-DM) 6.25-15 MG/5ML syrup Take 5 mLs by mouth at bedtime as needed for cough. 04/03/23  Yes Susann Givens, Kamaile Zachow A, NP  rosuvastatin (CRESTOR) 20 MG tablet Take 20 mg by mouth daily. 02/27/23  Yes [provider]  tretinoin (RETIN-A) 0.05 % cream Apply topically at bedtime. 02/22/22  Yes [provider]  glimepiride (AMARYL) 4 MG tablet Take 4 mg by mouth 2 (two) times daily. 09/06/20   [provider]  hydrochlorothiazide (HYDRODIURIL) 25 MG tablet Take 1 tablet (25 mg total) by mouth daily. 02/24/16   Joy, Shawn C, PA-C  hydrOXYzine (ATARAX) 25 MG tablet Take 25 mg by mouth at bedtime as needed.    [provider]  metFORMIN (GLUCOPHAGE) 500 MG tablet Take 500 mg by mouth daily. 02/21/16   [provider]  OZEMPIC, 0.25 OR 0.5 MG/DOSE, 2 MG/3ML SOPN Inject 0.25 mg into the skin once a week.    [provider]  Vitamin D, Ergocalciferol, (DRISDOL) 50000 units CAPS capsule Take 50,000 Units by mouth once a week. 11/26/15   [provider]    Family History Family History  Problem Relation Age of Onset   Diabetes Mother    CAD Father     Social History Social History   Tobacco Use   Smoking status: Every Day    Types: Cigarettes   Smokeless tobacco: Never  Vaping Use   Vaping status: Never Used  Substance Use Topics   Alcohol use: Not Currently    Comment: occ   Drug use: No     Allergies   Patient has no known allergies.   Review of Systems Review of Systems  Respiratory:  Positive for cough.    Per HPI  Physical Exam Triage Vital Signs ED Triage Vitals  Encounter Vitals Group     BP 04/03/23 1052 122/82     Systolic BP Percentile --      Diastolic BP Percentile --      Pulse Rate 04/03/23 1052 66     Resp 04/03/23 1052 16     Temp 04/03/23 1052 99.2 F (37.3 C)     Temp Source 04/03/23 1052 Oral     SpO2 04/03/23 1052 93 %  Weight 04/03/23 1052 155 lb (70.3 kg)     Height 04/03/23 1052 4\' 11"  (1.499 m)     Head Circumference --      Peak Flow --      Pain Score 04/03/23 1053 7     Pain Loc --      Pain Education --      Exclude from Growth Chart --    No data found.  Updated Vital Signs BP 122/82 (BP Location: Left Arm)   Pulse 66   Temp 99.2 F (37.3 C) (Oral)   Resp 16   Ht 4\' 11"  (1.499 m)   Wt 155 lb (70.3 kg)   SpO2 93%   BMI 31.31 kg/m   Visual Acuity Right Eye Distance:   Left Eye Distance:   Bilateral Distance:    Right Eye Near:   Left Eye Near:    Bilateral Near:     Physical Exam Vitals and nursing note reviewed.  Constitutional:      General: She is awake. She is not in acute distress.    Appearance: Normal appearance. She is well-developed and well-groomed. She is not ill-appearing.   HENT:     Right Ear: Tympanic membrane, ear canal and external ear normal.     Left Ear: Tympanic membrane, ear canal and external ear normal.     Nose: Congestion and rhinorrhea present.     Mouth/Throat:     Mouth: Mucous membranes are moist.     Pharynx: Posterior oropharyngeal erythema present. No oropharyngeal exudate.  Cardiovascular:     Rate and Rhythm: Normal rate and regular rhythm.  Pulmonary:     Effort: Pulmonary effort is normal.     Breath sounds: Normal breath sounds.  Skin:    General: Skin is warm and dry.  Neurological:     Mental Status: She is alert.  Psychiatric:        Behavior: Behavior is cooperative.      UC Treatments / Results  Labs (all labs ordered are listed, but only abnormal results are displayed) Labs Reviewed  POCT INFLUENZA A/B - Normal    EKG   Radiology No results found.  Procedures Procedures (including critical care time)  Medications Ordered in UC Medications - No data to display  Initial Impression / Assessment and Plan / UC Course  I have reviewed the triage vital signs and the nursing notes.  Pertinent labs & imaging results that were available during my care of the patient were reviewed by me and considered in my medical decision making (see chart for details).     Upon assessment congestion and rhinorrhea present, mild erythema noted to pharynx.  Lungs clear bilaterally to auscultation.  Flu testing negative.  Prescribed Tessalon and Promethazine DM as needed for cough.  Discussed over-the-counter medication for viral illness related symptoms.  Discussed return precautions. Final Clinical Impressions(s) / UC Diagnoses   Final diagnoses:  Viral respiratory illness  Acute cough     Discharge Instructions      You tested negative for flu today.  As discussed I believe your symptoms are related to a viral illness.  I prescribed Tessalon that you can take every 8 hours as needed for cough.  I have also prescribed  Promethazine DM cough syrup that you can take at night as needed for cough.  This can make you drowsy so do not drive, work, or drink alcohol while taking.  Otherwise you can alternate between Tylenol and Ibuprofen as needed  for pain and fever. I recommend Mucinex for cough and congestion as needed. You can also take over-the-counter Imodium as needed for diarrhea.  Stay hydrated and get plenty of rest. Return here if symptoms persist or worsen.       ED Prescriptions     Medication Sig Dispense Auth. Provider   benzonatate (TESSALON) 100 MG capsule Take 1 capsule (100 mg total) by mouth every 8 (eight) hours. 21 capsule Wynonia Lawman A, NP   promethazine-dextromethorphan (PROMETHAZINE-DM) 6.25-15 MG/5ML syrup Take 5 mLs by mouth at bedtime as needed for cough. 118 mL Wynonia Lawman A, NP      PDMP not reviewed this encounter.   Wynonia Lawman A, NP 04/03/23 1231

## 2023-04-06 ENCOUNTER — Ambulatory Visit (INDEPENDENT_AMBULATORY_CARE_PROVIDER_SITE_OTHER)

## 2023-04-06 ENCOUNTER — Ambulatory Visit (HOSPITAL_COMMUNITY)
Admission: EM | Admit: 2023-04-06 | Discharge: 2023-04-06 | Disposition: A | Discharge status: Home / Self Care | Attending: Family Medicine | Admitting: Family Medicine

## 2023-04-06 ENCOUNTER — Encounter (HOSPITAL_COMMUNITY): Payer: Self-pay

## 2023-04-06 DIAGNOSIS — J329 Chronic sinusitis, unspecified: Secondary | ICD-10-CM

## 2023-04-06 DIAGNOSIS — R0602 Shortness of breath: Secondary | ICD-10-CM

## 2023-04-06 DIAGNOSIS — J40 Bronchitis, not specified as acute or chronic: Secondary | ICD-10-CM

## 2023-04-06 MED ORDER — PREDNISONE 20 MG PO TABS: 40.0000 mg | ORAL_TABLET | Freq: Every day | ORAL | 0 refills | Status: AC

## 2023-04-06 MED ORDER — AMOXICILLIN-POT CLAVULANATE 875-125 MG PO TABS: 1.0000 | ORAL_TABLET | Freq: Two times a day (BID) | ORAL | 0 refills | Status: DC

## 2023-04-06 MED ORDER — IPRATROPIUM-ALBUTEROL 0.5-2.5 (3) MG/3ML IN SOLN
RESPIRATORY_TRACT | Status: AC
Start: 2023-04-06 — End: ?
  Filled 2023-04-06: qty 3

## 2023-04-06 MED ORDER — IPRATROPIUM-ALBUTEROL 0.5-2.5 (3) MG/3ML IN SOLN: 3.0000 mL | Freq: Four times a day (QID) | RESPIRATORY_TRACT | 0 refills | Status: AC | PRN

## 2023-04-06 MED ORDER — IPRATROPIUM-ALBUTEROL 0.5-2.5 (3) MG/3ML IN SOLN
3.0000 mL | Freq: Once | RESPIRATORY_TRACT | Status: AC
  Administered 2023-04-06: 3 mL via RESPIRATORY_TRACT

## 2023-04-06 MED ORDER — DOXYCYCLINE HYCLATE 100 MG PO CAPS: 100.0000 mg | ORAL_CAPSULE | Freq: Two times a day (BID) | ORAL | 0 refills | Status: DC

## 2023-04-06 NOTE — ED Provider Notes (Signed)
 [facility]    CSN: [id number] Arrival date & time: 04/06/23  5366      History   Chief Complaint Chief Complaint  Patient presents with   Cough    HPI KYLANI WIRES is a 58 y.o. female.   Patient presents today for reevaluation.  She reports developing symptoms 04/01/2023 consistent with a viral respiratory illness including cough and congestion.  She was seen by our clinic on 04/03/2023 at which point she tested negative for influenza and COVID testing was not obtained.  She was started on Promethazine DM and Tessalon which have been ineffective.  She has also been taking Tylenol, ibuprofen, Mucinex over-the-counter without improvement of symptoms.  She has had COVID several years ago but not more recently.  She has not had most recent COVID vaccines or influenza vaccination.  She denies any history of chronic lung condition including asthma, COPD, smoking.  She has never required albuterol.  She does have a history of diabetes but her last A1c was well-controlled at 7.2% on 02/24/2023.  She denies any recent antibiotics or steroids.  She reports that over the past several days she has had significant worsening of symptoms and is feeling short of breath.  She also reports chest tightness and severe cough that is interfering with her ability to sleep at night.    Past Medical History:  Diagnosis Date   Diabetes mellitus without complication (HCC)    Hypertension     There are no active problems to display for this patient.   History reviewed. No pertinent surgical history.  OB History   No obstetric history on file.      Home Medications    Prior to Admission medications   Medication Sig Start Date End Date Taking? Authorizing Provider  doxycycline (VIBRAMYCIN) 100 MG capsule Take 1 capsule (100 mg total) by mouth 2 (two) times daily. 04/06/23  Yes Sylvester Salonga K, PA-C  ipratropium-albuterol (DUONEB) 0.5-2.5 (3) MG/3ML SOLN Take 3 mLs by nebulization every 6  (six) hours as needed. 04/06/23  Yes Yalanda Soderman K, PA-C  predniSONE (DELTASONE) 20 MG tablet Take 2 tablets (40 mg total) by mouth daily for 3 days. 04/06/23 04/09/23 Yes Patsye Sullivant K, PA-C  benzonatate (TESSALON) 100 MG capsule Take 1 capsule (100 mg total) by mouth every 8 (eight) hours. 04/03/23   Wynonia Lawman A, NP  glimepiride (AMARYL) 4 MG tablet Take 4 mg by mouth 2 (two) times daily. 09/06/20   [provider]  hydrochlorothiazide (HYDRODIURIL) 25 MG tablet Take 1 tablet (25 mg total) by mouth daily. 02/24/16   Joy, Shawn C, PA-C  hydrOXYzine (ATARAX) 25 MG tablet Take 25 mg by mouth at bedtime as needed.    [provider]  metFORMIN (GLUCOPHAGE) 500 MG tablet Take 500 mg by mouth daily. 02/21/16   [provider]  naproxen (NAPROSYN) 375 MG tablet Take 375 mg by mouth 2 (two) times daily. 02/28/23   [provider]  OZEMPIC, 0.25 OR 0.5 MG/DOSE, 2 MG/3ML SOPN Inject 0.25 mg into the skin once a week.    [provider]  promethazine-dextromethorphan (PROMETHAZINE-DM) 6.25-15 MG/5ML syrup Take 5 mLs by mouth at bedtime as needed for cough. 04/03/23   Wynonia Lawman A, NP  rosuvastatin (CRESTOR) 20 MG tablet Take 20 mg by mouth daily. 02/27/23   [provider]  tretinoin (RETIN-A) 0.05 % cream Apply topically at bedtime. 02/22/22   [provider]  Vitamin D, Ergocalciferol, (DRISDOL) 50000 units CAPS  capsule Take 50,000 Units by mouth once a week. 11/26/15   [provider]    Family History Family History  Problem Relation Age of Onset   Diabetes Mother    CAD Father     Social History Social History   Tobacco Use   Smoking status: Every Day    Types: Cigarettes   Smokeless tobacco: Never  Vaping Use   Vaping status: Never Used  Substance Use Topics   Alcohol use: Not Currently    Comment: occ   Drug use: No     Allergies   Patient has no known allergies.   Review of Systems Review of Systems   Constitutional:  Positive for activity change. Negative for appetite change, fatigue and fever.  HENT:  Positive for congestion. Negative for sinus pressure, sneezing and sore throat.   Respiratory:  Positive for cough, chest tightness and shortness of breath. Negative for wheezing.   Cardiovascular:  Negative for chest pain and palpitations.  Gastrointestinal:  Negative for abdominal pain, diarrhea, nausea and vomiting.     Physical Exam Triage Vital Signs ED Triage Vitals  Encounter Vitals Group     BP 04/06/23 0833 119/77     Systolic BP Percentile --      Diastolic BP Percentile --      Pulse Rate 04/06/23 0833 (!) 111     Resp 04/06/23 0833 16     Temp 04/06/23 0833 99.5 F (37.5 C)     Temp Source 04/06/23 0833 Oral     SpO2 04/06/23 0833 90 %     Weight --      Height --      Head Circumference --      Peak Flow --      Pain Score 04/06/23 0837 0     Pain Loc --      Pain Education --      Exclude from Growth Chart --    No data found.  Updated Vital Signs BP 119/77 (BP Location: Right Arm)   Pulse (!) 108   Temp 99.5 F (37.5 C) (Oral)   Resp 16   SpO2 94%   Visual Acuity Right Eye Distance:   Left Eye Distance:   Bilateral Distance:    Right Eye Near:   Left Eye Near:    Bilateral Near:     Physical Exam Vitals reviewed.  Constitutional:      General: She is awake. She is not in acute distress.    Appearance: Normal appearance. She is well-developed. She is not ill-appearing.     Comments: Very pleasant female appears stated age in no acute distress sitting comfortably in exam room  HENT:     Head: Normocephalic and atraumatic.     Right Ear: Tympanic membrane, ear canal and external ear normal. Tympanic membrane is not erythematous or bulging.     Left Ear: Tympanic membrane, ear canal and external ear normal. Tympanic membrane is not erythematous or bulging.     Nose:     Right Sinus: No maxillary sinus tenderness or frontal sinus tenderness.      Left Sinus: No maxillary sinus tenderness or frontal sinus tenderness.     Mouth/Throat:     Pharynx: Uvula midline. No oropharyngeal exudate or posterior oropharyngeal erythema.  Cardiovascular:     Rate and Rhythm: Regular rhythm. Tachycardia present.     Heart sounds: Normal heart sounds, S1 normal and S2 normal. No murmur heard. Pulmonary:  Effort: Pulmonary effort is normal.     Breath sounds: Examination of the right-lower field reveals decreased breath sounds. Examination of the left-lower field reveals decreased breath sounds. Decreased breath sounds present. No wheezing, rhonchi or rales.  Psychiatric:        Behavior: Behavior is cooperative.      UC Treatments / Results  Labs (all labs ordered are listed, but only abnormal results are displayed) Labs Reviewed - No data to display  EKG   Radiology No results found.  Procedures Procedures (including critical care time)  Medications Ordered in UC Medications  ipratropium-albuterol (DUONEB) 0.5-2.5 (3) MG/3ML nebulizer solution 3 mL (3 mLs Nebulization Given 04/06/23 0853)    Initial Impression / Assessment and Plan / UC Course  I have reviewed the triage vital signs and the nursing notes.  Pertinent labs & imaging results that were available during my care of the patient were reviewed by me and considered in my medical decision making (see chart for details).     Patient was initially tachycardic and her oxygen saturation fluctuated between 89 and 91% on room air with associated shortness of breath.  She was given a DuoNeb and her oxygen saturation significantly improved to consistently above 93%; she was monitored for approximately 10 minutes without oxygen without desaturation.  She was sent home with a nebulizer with instruction to use DuoNebs every 4-6 hours as needed for shortness of breath and coughing fits.  Chest x-ray was obtained that showed patchy perihilar opacities that are chronic and unchanged  from 04/21/2021 films.  No acute cardiopulmonary disease based on my primary read but we were waiting for radiologist overread at the time of discharge and we will contact her if this differs and changes our treatment plan.  Will treat for atypical pneumonia with doxycycline 100 mg twice daily for 10 days.  We discussed that she should not avoid prolonged sun exposure while on this medication due to associated photosensitivity.  She was also started on short course of prednisone (40 mg for 3 days).  We discussed that this will cause hyperglycemia and so she should avoid carbohydrates and drink plenty of fluid.  She is to monitor her blood sugar regularly and if this is persistently elevated return here or see her PCP.  She was encouraged to use a pulse oximeter at home to monitor both her oxygen saturation and her heart rate.  We discussed that if her oxygen saturation drops below 90% she needs to go to the emergency room.  We also discussed that if her heart rate remains elevated even after she is feeling better she should follow-up with her primary care.  She does have a primary care and was encouraged to follow-up with them first thing next week.  We discussed that if anything worsens or changes she should go to the emergency room immediately.  Should return precautions given.  Work excuse note provided.  Final Clinical Impressions(s) / UC Diagnoses   Final diagnoses:  SOB (shortness of breath)  Sinobronchitis     Discharge Instructions      We are treating you for an atypical pneumonia.  Start doxycycline 100 mg twice daily for 10 days.  Stay out of the sun while on this medication.  Use the nebulizer and DuoNebs every 4-6 hours as needed for shortness of breath and coughing fits.  Start prednisone 40 mg for 3 days.  Do not take NSAIDs with this medication including aspirin, ibuprofen/Advil, naproxen/Aleve.  This will raise your  blood sugar so avoid carbohydrates and drink lots of fluid.  Obtain a  pulse oximeter from the pharmacy.  If your oxygen goes below 90% you must go to the ER.  If your heart rate remains above 100 even after you are feeling better please follow-up with our clinic or your primary care.  Follow-up with your PCP for Thedore Mins next week and if you are unable to see them please return here.  If anything worsens go to the ER as we discussed.     ED Prescriptions     Medication Sig Dispense Auth. Provider   ipratropium-albuterol (DUONEB) 0.5-2.5 (3) MG/3ML SOLN Take 3 mLs by nebulization every 6 (six) hours as needed. 90 mL Shaun Runyon K, PA-C   predniSONE (DELTASONE) 20 MG tablet Take 2 tablets (40 mg total) by mouth daily for 3 days. 6 tablet Dan Dissinger K, PA-C   doxycycline (VIBRAMYCIN) 100 MG capsule Take 1 capsule (100 mg total) by mouth 2 (two) times daily. 20 capsule Beadie Matsunaga, Noberto Retort, PA-C      PDMP not reviewed this encounter.   Jeani Hawking, PA-C 04/06/23 4010

## 2023-04-06 NOTE — Discharge Instructions (Signed)
 We are treating you for an atypical pneumonia.  Start doxycycline 100 mg twice daily for 10 days.  Stay out of the sun while on this medication.  Use the nebulizer and DuoNebs every 4-6 hours as needed for shortness of breath and coughing fits.  Start prednisone 40 mg for 3 days.  Do not take NSAIDs with this medication including aspirin, ibuprofen/Advil, naproxen/Aleve.  This will raise your blood sugar so avoid carbohydrates and drink lots of fluid.  Obtain a pulse oximeter from the pharmacy.  If your oxygen goes below 90% you must go to the ER.  If your heart rate remains above 100 even after you are feeling better please follow-up with our clinic or your primary care.  Follow-up with your PCP for Thedore Mins next week and if you are unable to see them please return here.  If anything worsens go to the ER as we discussed.

## 2023-04-06 NOTE — ED Triage Notes (Signed)
 Patient here today with c/o cough, fever, SOB, right side facial swelling, and nasal congestion X 1 week. Patient was here 3 days ago with the same symptoms. Patient states that her symptoms are worsening.

## 2023-05-04 ENCOUNTER — Ambulatory Visit (HOSPITAL_COMMUNITY)
Admission: EM | Admit: 2023-05-04 | Discharge: 2023-05-04 | Disposition: A | Discharge status: Home / Self Care | Attending: Family Medicine | Admitting: Family Medicine

## 2023-05-04 ENCOUNTER — Other Ambulatory Visit: Payer: Self-pay

## 2023-05-04 ENCOUNTER — Encounter (HOSPITAL_COMMUNITY): Payer: Self-pay | Admitting: Emergency Medicine

## 2023-05-04 DIAGNOSIS — J069 Acute upper respiratory infection, unspecified: Secondary | ICD-10-CM

## 2023-05-04 LAB — POC COVID19/FLU A&B COMBO
Covid Antigen, POC: NEGATIVE
Influenza A Antigen, POC: NEGATIVE
Influenza B Antigen, POC: NEGATIVE

## 2023-05-04 MED ORDER — ACETAMINOPHEN 325 MG PO TABS
ORAL_TABLET | ORAL | Status: AC
  Filled 2023-05-04: qty 3

## 2023-05-04 MED ORDER — BENZONATATE 100 MG PO CAPS: 100.0000 mg | ORAL_CAPSULE | Freq: Three times a day (TID) | ORAL | 0 refills | Status: DC

## 2023-05-04 MED ORDER — ACETAMINOPHEN 325 MG PO TABS
975.0000 mg | ORAL_TABLET | Freq: Once | ORAL | Status: AC
  Administered 2023-05-04: 975 mg via ORAL

## 2023-05-04 NOTE — Discharge Instructions (Signed)
 Results for orders placed or performed during the hospital encounter of 05/04/23  POC Covid19/Flu A&B Antigen   Collection Time: 05/04/23 12:06 PM  Result Value Ref Range   Influenza A Antigen, POC Negative Negative   Influenza B Antigen, POC Negative Negative   Covid Antigen, POC Negative Negative

## 2023-05-04 NOTE — ED Triage Notes (Signed)
 Pt c/o cough, fever, body aches. Last Tylenol today at 3:30 am.

## 2023-05-04 NOTE — ED Provider Notes (Signed)
 [facility]   [id number] 05/04/23 Arrival Time: 1008  ASSESSMENT & PLAN:  1. Viral URI with cough    Discussed typical duration of likely viral illness. Results for orders placed or performed during the hospital encounter of 05/04/23  POC Covid19/Flu A&B Antigen   Collection Time: 05/04/23 12:06 PM  Result Value Ref Range   Influenza A Antigen, POC Negative Negative   Influenza B Antigen, POC Negative Negative   Covid Antigen, POC Negative Negative   OTC symptom care as needed.  Meds ordered this encounter  Medications   acetaminophen (TYLENOL) tablet 975 mg   benzonatate (TESSALON) 100 MG capsule    Sig: Take 1 capsule (100 mg total) by mouth every 8 (eight) hours.    Dispense:  21 capsule    Refill:  0     Follow-up Information     Fleet Contras, MD.   Specialty: Internal Medicine Why: As needed. Contact information: [address] [place] [address] [phone]                 Reviewed expectations re: course of current medical issues. Questions answered. Outlined signs and symptoms indicating need for more acute intervention. Understanding verbalized. After Visit Summary given.   SUBJECTIVE: History from: Patient. Lynn Compton is a 58 y.o. female. Pt c/o cough, fever, body aches. Last Tylenol today at 3:30 am. Symptoms x 1-2 days. Denies: difficulty breathing. Normal PO intake without n/v/d.  OBJECTIVE:  Vitals:   05/04/23 1115  BP: 113/79  Pulse: (!) 110  Resp: (!) 21  Temp: (!) 102.6 F (39.2 C)  TempSrc: Oral  SpO2: 95%    General appearance: alert; no distress Eyes: PERRLA; EOMI; conjunctiva normal HENT: [place]; AT; with nasal congestion Neck: supple  Lungs: speaks full sentences without difficulty; unlabored; dry cough; CTAB Extremities: no edema Skin: warm and dry Neurologic: normal gait Psychological: alert and cooperative; normal mood and affect  Labs: Results for orders placed or performed during the  hospital encounter of 05/04/23  POC Covid19/Flu A&B Antigen   Collection Time: 05/04/23 12:06 PM  Result Value Ref Range   Influenza A Antigen, POC Negative Negative   Influenza B Antigen, POC Negative Negative   Covid Antigen, POC Negative Negative   Labs Reviewed  POC COVID19/FLU A&B COMBO    Imaging: No results found.  No Known Allergies  Past Medical History:  Diagnosis Date   Diabetes mellitus without complication (HCC)    Hypertension    Social History   Socioeconomic History   Marital status: Single    Spouse name: Not on file   Number of children: Not on file   Years of education: Not on file   Highest education level: Not on file  Occupational History   Not on file  Tobacco Use   Smoking status: Every Day    Types: Cigarettes   Smokeless tobacco: Never  Vaping Use   Vaping status: Never Used  Substance and Sexual Activity   Alcohol use: Not Currently    Comment: occ   Drug use: No   Sexual activity: Not on file  Other Topics Concern   Not on file  Social History Narrative   Not on file   Social Drivers of Health   Financial Resource Strain: Not on File (08/11/2022)   Received from [employer]    Financial Resource Strain: 0  Food Insecurity: Not on File (10/20/2022)   Received from [employer]  Food: 0  Transportation Needs: Not on File (08/11/2022)   Received from [employer] [facility]    Transportation: 0  Physical Activity: Not on File (08/11/2022)   Received from [facility]   Physical Activity    Physical Activity: 0  Stress: Not on File (08/11/2022)   Received from [facility]   Stress    Stress: 0  Social Connections: Not on File (10/14/2022)   Received from [employer]   Social Connections    Connectedness: 0  Intimate Partner Violence: Not on file   Family History  Problem Relation Age of Onset   Diabetes Mother    CAD Father    History reviewed. No pertinent surgical history.   Mardella Layman,  MD 05/04/23 1321

## 2023-08-01 ENCOUNTER — Ambulatory Visit (HOSPITAL_COMMUNITY)
Admission: EM | Admit: 2023-08-01 | Discharge: 2023-08-01 | Disposition: A | Discharge status: Home / Self Care | Attending: Emergency Medicine | Admitting: Emergency Medicine

## 2023-08-01 ENCOUNTER — Encounter (HOSPITAL_COMMUNITY): Payer: Self-pay

## 2023-08-01 DIAGNOSIS — H00011 Hordeolum externum right upper eyelid: Secondary | ICD-10-CM | POA: Diagnosis not present

## 2023-08-01 MED ORDER — ERYTHROMYCIN 5 MG/GM OP OINT: TOPICAL_OINTMENT | OPHTHALMIC | 0 refills | Status: AC

## 2023-08-01 NOTE — ED Triage Notes (Signed)
 Patient here today with c/o right eye pain and swelling X 2 days. She has some blurry vision. Patient wears glasses.

## 2023-08-01 NOTE — Discharge Instructions (Signed)
 Erythromycin  ointment nightly for 5 nights Warm compress to the eyelid for 15 minutes, 4-5 times daily for the next week

## 2023-08-01 NOTE — ED Provider Notes (Signed)
 [facility]    CSN: [id number] Arrival date & time: 08/01/23  0825      History   Chief Complaint Chief Complaint  Patient presents with   Eye Problem    HPI Lynn Compton is a 58 y.o. female.   2-day history of right upper eyelid swelling and pain She denies any injury or trauma.  Not having any discharge or drainage.  No pain with eye movement.  Does report a little blurriness of the eye.  She wears glasses.  Past Medical History:  Diagnosis Date   Diabetes mellitus without complication (HCC)    Hypertension     There are no active problems to display for this patient.   History reviewed. No pertinent surgical history.  OB History   No obstetric history on file.      Home Medications    Prior to Admission medications   Medication Sig Start Date End Date Taking? Authorizing Provider  erythromycin  ophthalmic ointment Place a 1/2 inch ribbon of ointment into the lower eyelid nightly for 5 nights 08/01/23  Yes Marlita Keil, PA-C  glimepiride (AMARYL) 4 MG tablet Take 4 mg by mouth 2 (two) times daily. 09/06/20   [provider]  hydrochlorothiazide  (HYDRODIURIL ) 25 MG tablet Take 1 tablet (25 mg total) by mouth daily. 02/24/16   Joy, Shawn C, PA-C  hydrOXYzine (ATARAX) 25 MG tablet Take 25 mg by mouth at bedtime as needed.    [provider]  ipratropium-albuterol  (DUONEB) 0.5-2.5 (3) MG/3ML SOLN Take 3 mLs by nebulization every 6 (six) hours as needed. 04/06/23   Raspet, Erin K, PA-C  metFORMIN (GLUCOPHAGE) 500 MG tablet Take 500 mg by mouth daily. 02/21/16   [provider]  naproxen (NAPROSYN) 375 MG tablet Take 375 mg by mouth 2 (two) times daily. 02/28/23   [provider]  OZEMPIC, 0.25 OR 0.5 MG/DOSE, 2 MG/3ML SOPN Inject 0.25 mg into the skin once a week.    [provider]  rosuvastatin (CRESTOR) 20 MG tablet Take 20 mg by mouth daily. 02/27/23   [provider]  tretinoin (RETIN-A) 0.05 % cream  Apply topically at bedtime. 02/22/22   [provider]  Vitamin D , Ergocalciferol , (DRISDOL) 50000 units CAPS capsule Take 50,000 Units by mouth once a week. 11/26/15   [provider]    Family History Family History  Problem Relation Age of Onset   Diabetes Mother    CAD Father     Social History Social History   Tobacco Use   Smoking status: Every Day    Types: Cigarettes   Smokeless tobacco: Never  Vaping Use   Vaping status: Never Used  Substance Use Topics   Alcohol use: Yes    Comment: occ   Drug use: No     Allergies   Patient has no known allergies.   Review of Systems Review of Systems  As per HPI  Physical Exam Triage Vital Signs ED Triage Vitals  Encounter Vitals Group     BP 08/01/23 0922 (!) 147/59     Girls Systolic BP Percentile --      Girls Diastolic BP Percentile --      Boys Systolic BP Percentile --      Boys Diastolic BP Percentile --      Pulse Rate 08/01/23 0922 (!) 53     Resp 08/01/23 0922 16     Temp 08/01/23 0922 98.5 F (36.9 C)     Temp Source 08/01/23  9077 Oral     SpO2 08/01/23 0922 95 %     Weight --      Height --      Head Circumference --      Peak Flow --      Pain Score 08/01/23 0925 6     Pain Loc --      Pain Education --      Exclude from Growth Chart --    No data found.  Updated Vital Signs BP (!) 147/59 (BP Location: Right Arm)   Pulse (!) 53   Temp 98.5 F (36.9 C) (Oral)   Resp 16   LMP  (LMP Unknown)   SpO2 95%   Visual Acuity Right Eye Distance: 20/20 Left Eye Distance: 20/20 Bilateral Distance: 20/15  Right Eye Near:   Left Eye Near:    Bilateral Near:     Physical Exam Vitals and nursing note reviewed.  Constitutional:      General: She is not in acute distress.    Appearance: She is not ill-appearing.  Eyes:     General: Lids are everted, no foreign bodies appreciated. Vision grossly intact. Gaze aligned appropriately.        Right eye: Hordeolum present.      Extraocular Movements: Extraocular movements intact.     Right eye: Normal extraocular motion.     Conjunctiva/sclera: Conjunctivae normal.     Right eye: Right conjunctiva is not injected.     Pupils: Pupils are equal, round, and reactive to light.      Comments: Stye on the lash line right upper lid. Minimal localized swelling. Tender to touch. There is no periorbital swelling or erythema. Vision intact. EOM normal without pain. No conjunctival injection or discharge   Cardiovascular:     Rate and Rhythm: Normal rate and regular rhythm.  Pulmonary:     Effort: Pulmonary effort is normal.  Musculoskeletal:     Cervical back: Normal range of motion.  Skin:    General: Skin is warm and dry.  Neurological:     Mental Status: She is alert and oriented to person, place, and time.     UC Treatments / Results  Labs (all labs ordered are listed, but only abnormal results are displayed) Labs Reviewed - No data to display  EKG   Radiology No results found.  Procedures Procedures (including critical care time)  Medications Ordered in UC Medications - No data to display  Initial Impression / Assessment and Plan / UC Course  I have reviewed the triage vital signs and the nursing notes.  Pertinent labs & imaging results that were available during my care of the patient were reviewed by me and considered in my medical decision making (see chart for details).    Stye, right upper lid No red flags Warm compresses recommended. Erythromycin  ointment for comfort with blinking. Return if needed No questions  Final Clinical Impressions(s) / UC Diagnoses   Final diagnoses:  Hordeolum externum of right upper eyelid     Discharge Instructions      Erythromycin  ointment nightly for 5 nights Warm compress to the eyelid for 15 minutes, 4-5 times daily for the next week     ED Prescriptions     Medication Sig Dispense Auth. Provider   erythromycin  ophthalmic ointment Place a  1/2 inch ribbon of ointment into the lower eyelid nightly for 5 nights 3.5 g Anala Whisenant, [place], PA-C      PDMP not reviewed this encounter.  [place], [place], PA-C 08/01/23 1049

## 2023-09-04 ENCOUNTER — Emergency Department (HOSPITAL_COMMUNITY)

## 2023-09-04 ENCOUNTER — Other Ambulatory Visit: Payer: Self-pay

## 2023-09-04 ENCOUNTER — Encounter (HOSPITAL_COMMUNITY): Payer: Self-pay

## 2023-09-04 ENCOUNTER — Emergency Department (HOSPITAL_COMMUNITY)
Admission: EM | Admit: 2023-09-04 | Discharge: 2023-09-04 | Disposition: A | Discharge status: Home / Self Care | Attending: Emergency Medicine | Admitting: Emergency Medicine

## 2023-09-04 DIAGNOSIS — E876 Hypokalemia: Secondary | ICD-10-CM | POA: Insufficient documentation

## 2023-09-04 DIAGNOSIS — R0602 Shortness of breath: Secondary | ICD-10-CM | POA: Diagnosis present

## 2023-09-04 DIAGNOSIS — E119 Type 2 diabetes mellitus without complications: Secondary | ICD-10-CM | POA: Insufficient documentation

## 2023-09-04 DIAGNOSIS — E8770 Fluid overload, unspecified: Secondary | ICD-10-CM | POA: Diagnosis not present

## 2023-09-04 DIAGNOSIS — D72829 Elevated white blood cell count, unspecified: Secondary | ICD-10-CM | POA: Diagnosis not present

## 2023-09-04 DIAGNOSIS — Z7984 Long term (current) use of oral hypoglycemic drugs: Secondary | ICD-10-CM | POA: Diagnosis not present

## 2023-09-04 DIAGNOSIS — Z79899 Other long term (current) drug therapy: Secondary | ICD-10-CM | POA: Diagnosis not present

## 2023-09-04 DIAGNOSIS — I10 Essential (primary) hypertension: Secondary | ICD-10-CM | POA: Diagnosis not present

## 2023-09-04 DIAGNOSIS — R7989 Other specified abnormal findings of blood chemistry: Secondary | ICD-10-CM

## 2023-09-04 DIAGNOSIS — R609 Edema, unspecified: Secondary | ICD-10-CM

## 2023-09-04 LAB — BASIC METABOLIC PANEL WITH GFR
Anion gap: 12 (ref 5–15)
BUN: <5 mg/dL — ABNORMAL LOW (ref 6–20)
CO2: 24 mmol/L (ref 22–32)
Calcium: 9.3 mg/dL (ref 8.9–10.3)
Chloride: 103 mmol/L (ref 98–111)
Creatinine, Ser: 0.61 mg/dL (ref 0.44–1.00)
GFR, Estimated: >60 mL/min (ref >60)
Glucose, Bld: 96 mg/dL (ref 70–99)
Potassium: 3.1 mmol/L — ABNORMAL LOW (ref 3.5–5.1)
Sodium: 139 mmol/L (ref 135–145)

## 2023-09-04 LAB — CBC
HCT: 39.5 % (ref 36.0–46.0)
Hemoglobin: 12.2 g/dL (ref 12.0–15.0)
MCH: 23.6 pg — ABNORMAL LOW (ref 26.0–34.0)
MCHC: 30.9 g/dL (ref 30.0–36.0)
MCV: 76.6 fL — ABNORMAL LOW (ref 80.0–100.0)
Platelets: 415 K/uL — ABNORMAL HIGH (ref 150–400)
RBC: 5.16 MIL/uL — ABNORMAL HIGH (ref 3.87–5.11)
RDW: 20.8 % — ABNORMAL HIGH (ref 11.5–15.5)
WBC: 12.6 K/uL — ABNORMAL HIGH (ref 4.0–10.5)
nRBC: 0 % (ref 0.0–0.2)

## 2023-09-04 LAB — DIFFERENTIAL
Abs Immature Granulocytes: 0.05 K/uL (ref 0.00–0.07)
Basophils Absolute: 0 K/uL (ref 0.0–0.1)
Basophils Relative: 0 %
Eosinophils Absolute: 0.1 K/uL (ref 0.0–0.5)
Eosinophils Relative: 1 %
Immature Granulocytes: 0 %
Lymphocytes Relative: 23 %
Lymphs Abs: 2.9 K/uL (ref 0.7–4.0)
Monocytes Absolute: 0.6 K/uL (ref 0.1–1.0)
Monocytes Relative: 5 %
Neutro Abs: 8.9 K/uL — ABNORMAL HIGH (ref 1.7–7.7)
Neutrophils Relative %: 71 %

## 2023-09-04 LAB — RESP PANEL BY RT-PCR (RSV, FLU A&B, COVID)  RVPGX2
Influenza A by PCR: NEGATIVE
Influenza B by PCR: NEGATIVE
Resp Syncytial Virus by PCR: NEGATIVE
SARS Coronavirus 2 by RT PCR: NEGATIVE

## 2023-09-04 LAB — TROPONIN I (HIGH SENSITIVITY)
Troponin I (High Sensitivity): 18 ng/L — ABNORMAL HIGH (ref <18)
Troponin I (High Sensitivity): 19 ng/L — ABNORMAL HIGH (ref <18)

## 2023-09-04 LAB — HEPATIC FUNCTION PANEL
ALT: 34 U/L (ref 0–44)
AST: 25 U/L (ref 15–41)
Albumin: 3.4 g/dL — ABNORMAL LOW (ref 3.5–5.0)
Alkaline Phosphatase: 98 U/L (ref 38–126)
Bilirubin, Direct: <0.1 mg/dL (ref 0.0–0.2)
Total Bilirubin: 0.7 mg/dL (ref 0.0–1.2)
Total Protein: 7.2 g/dL (ref 6.5–8.1)

## 2023-09-04 LAB — D-DIMER, QUANTITATIVE: D-Dimer, Quant: 0.73 ug{FEU}/mL — ABNORMAL HIGH (ref 0.00–0.50)

## 2023-09-04 LAB — BRAIN NATRIURETIC PEPTIDE: B Natriuretic Peptide: 108.6 pg/mL — ABNORMAL HIGH (ref 0.0–100.0)

## 2023-09-04 MED ORDER — IOHEXOL 350 MG/ML SOLN
75.0000 mL | Freq: Once | INTRAVENOUS | Status: AC | PRN
  Administered 2023-09-04 (×2): 75 mL via INTRAVENOUS

## 2023-09-04 MED ORDER — HYDROXYZINE HCL 25 MG PO TABS
25.0000 mg | ORAL_TABLET | Freq: Once | ORAL | Status: AC
  Administered 2023-09-04 (×2): 25 mg via ORAL
  Filled 2023-09-04: qty 1

## 2023-09-04 MED ORDER — POTASSIUM CHLORIDE CRYS ER 20 MEQ PO TBCR
60.0000 meq | EXTENDED_RELEASE_TABLET | Freq: Once | ORAL | Status: AC
  Administered 2023-09-04 (×2): 60 meq via ORAL
  Filled 2023-09-04: qty 3

## 2023-09-04 NOTE — ED Triage Notes (Signed)
 Patient arrives POV for evaluation of shortness of breath. Patient reports cough and shortness of breath on exertion. Patient states she recently began Losartan for HTN and states symptoms began shortly after beginning the medication.

## 2023-09-04 NOTE — ED Provider Notes (Signed)
[place] EMERGENCY DEPARTMENT AT [facility] Provider Note   CSN: [id number] Arrival date & time: 09/04/23  9382     Patient presents with: Shortness of Breath   Lynn Compton is a 58 y.o. female.  Patient with past history significant for hypertension, type 2 diabetes presents to the emergency department concerns of shortness of breath.  Reports that she has been having shortness of breath over the last 2 weeks that really worsens with exertion.  Endorses a slight cough.  She states that she started about 3 weeks ago any blood pressure medication, losartan, after reportedly being switched from hydrochlorothiazide  by her PCP.  She states that she has never had any similar reactions to blood pressure medications.  Denies any feelings of chest pain or shortness of breath at this time.  No sick contacts.   Shortness of Breath      Prior to Admission medications   Medication Sig Start Date End Date Taking? Authorizing Provider  glimepiride (AMARYL) 4 MG tablet Take 4 mg by mouth daily with breakfast. 09/06/20  Yes [provider]  hydrOXYzine  (ATARAX ) 25 MG tablet Take 25 mg by mouth at bedtime as needed.   Yes [provider]  ipratropium-albuterol  (DUONEB) 0.5-2.5 (3) MG/3ML SOLN Take 3 mLs by nebulization every 6 (six) hours as needed. 04/06/23  Yes Raspet, Erin K, PA-C  losartan (COZAAR) 25 MG tablet Take 25 mg by mouth daily. 09/01/23  Yes [provider]  metFORMIN (GLUCOPHAGE) 1000 MG tablet Take 1,000 mg by mouth 2 (two) times daily with a meal.   Yes [provider]  OZEMPIC, 0.25 OR 0.5 MG/DOSE, 2 MG/3ML SOPN Inject 0.5 mg into the skin once a week.   Yes [provider]  tretinoin (RETIN-A) 0.05 % cream Apply topically at bedtime. 02/22/22  Yes [provider]  Vitamin D , Ergocalciferol , (DRISDOL) 50000 units CAPS capsule Take 50,000 Units by mouth once a week. 11/26/15  Yes [provider]  erythromycin   ophthalmic ointment Place a 1/2 inch ribbon of ointment into the lower eyelid nightly for 5 nights Patient not taking: Reported on 09/04/2023 08/01/23   Rising, Asberry, PA-C  hydrochlorothiazide  (HYDRODIURIL ) 25 MG tablet Take 1 tablet (25 mg total) by mouth daily. Patient not taking: Reported on 09/04/2023 02/24/16   Zada Elouise BROCKS, PA-C    Allergies: Patient has no known allergies.    Review of Systems  Respiratory:  Positive for shortness of breath.   All other systems reviewed and are negative.   Updated Vital Signs BP 138/77   Pulse (!) 106   Temp 98.2 F (36.8 C) (Oral)   Resp 17   Ht 4' 11 (1.499 m)   Wt 68.5 kg   SpO2 96%   BMI 30.50 kg/m   Physical Exam Vitals and nursing note reviewed.  Constitutional:      General: She is not in acute distress.    Appearance: She is well-developed.  HENT:     Head: Normocephalic and atraumatic.  Eyes:     Conjunctiva/sclera: Conjunctivae normal.  Cardiovascular:     Rate and Rhythm: Normal rate and regular rhythm.     Heart sounds: No murmur heard. Pulmonary:     Effort: Pulmonary effort is normal. Tachypnea present. No respiratory distress.     Breath sounds: Normal breath sounds. No decreased breath sounds, wheezing, rhonchi or rales.  Abdominal:     Palpations: Abdomen is soft.     Tenderness: There is no abdominal  tenderness.  Musculoskeletal:        General: No swelling.     Cervical back: Neck supple.     Right lower leg: No edema.     Left lower leg: No edema.  Skin:    General: Skin is warm and dry.     Capillary Refill: Capillary refill takes less than 2 seconds.  Neurological:     Mental Status: She is alert.  Psychiatric:        Mood and Affect: Mood normal.     (all labs ordered are listed, but only abnormal results are displayed) Labs Reviewed  BASIC METABOLIC PANEL WITH GFR - Abnormal; Notable for the following components:      Result Value   Potassium 3.1 (*)    BUN <5 (*)    All other components  within normal limits  CBC - Abnormal; Notable for the following components:   WBC 12.6 (*)    RBC 5.16 (*)    MCV 76.6 (*)    MCH 23.6 (*)    RDW 20.8 (*)    Platelets 415 (*)    All other components within normal limits  DIFFERENTIAL - Abnormal; Notable for the following components:   Neutro Abs 8.9 (*)    All other components within normal limits  BRAIN NATRIURETIC PEPTIDE - Abnormal; Notable for the following components:   B Natriuretic Peptide 108.6 (*)    All other components within normal limits  HEPATIC FUNCTION PANEL - Abnormal; Notable for the following components:   Albumin 3.4 (*)    All other components within normal limits  D-DIMER, QUANTITATIVE (NOT AT [facility]) - Abnormal; Notable for the following components:   D-Dimer, Quant 0.73 (*)    All other components within normal limits  TROPONIN I (HIGH SENSITIVITY) - Abnormal; Notable for the following components:   Troponin I (High Sensitivity) 18 (*)    All other components within normal limits  TROPONIN I (HIGH SENSITIVITY) - Abnormal; Notable for the following components:   Troponin I (High Sensitivity) 19 (*)    All other components within normal limits  RESP PANEL BY RT-PCR (RSV, FLU A&B, COVID)  [id number]    EKG: EKG Interpretation Date/Time:  Monday September 04 2023 06:31:16 EDT Ventricular Rate:  109 PR Interval:  142 QRS Duration:  89 QT Interval:  289 QTC Calculation: 390 R Axis:   83  Text Interpretation: Sinus tachycardia LAE, consider biatrial enlargement Borderline T wave abnormalities Confirmed by Lynn Sharper [phone]) on 09/04/2023 9:13:12 AM  Radiology: CT Angio Chest PE W and/or Wo Contrast Result Date: 09/04/2023 CLINICAL DATA:  Shortness of breath, cough. EXAM: CT ANGIOGRAPHY CHEST WITH CONTRAST TECHNIQUE: Multidetector CT imaging of the chest was performed using the standard protocol during bolus administration of intravenous contrast. Multiplanar CT image reconstructions and MIPs were obtained to  evaluate the vascular anatomy. RADIATION DOSE REDUCTION: This exam was performed according to the departmental dose-optimization program which includes automated exposure control, adjustment of the mA and/or kV according to patient size and/or use of iterative reconstruction technique. CONTRAST:  75mL OMNIPAQUE  IOHEXOL  350 MG/ML SOLN COMPARISON:  08/17/2010. FINDINGS: Cardiovascular: Negative for pulmonary embolus. Atherosclerotic calcification of the aorta. Enlarged pulmonic trunk and heart. Left ventricular dilatation and probable hypertrophy. No pericardial effusion. Mediastinum/Nodes: Low-attenuation lesions in the thyroid measure up to 2.1 cm on the right. Mediastinal lymph nodes measure up to 1.3 cm in the low right paratracheal station. Bihilar adenopathy measures up to 2.4 cm on the left. No  axillary adenopathy. Esophagus is grossly unremarkable. Lungs/Pleura: Image quality is degraded by respiratory motion. Septal thickening with small to moderate bilateral pleural effusions. Minimal scattered bibasilar subsegmental atelectasis. Airway is unremarkable. Upper Abdomen: Reflux of contrast into the IVC and hepatic veins. Small left adrenal adenoma. No specific follow-up necessary. Mild gastric wall thickening. Visualized portions of the liver, gallbladder, adrenal glands, kidneys, spleen, pancreas, stomach and bowel are otherwise grossly unremarkable. No upper abdominal adenopathy. Musculoskeletal: Degenerative changes in the spine. Sclerotic lesion in the proximal right humerus, with internal lucencies and enlargement from 08/17/2010. Review of the MIP images confirms the above findings. IMPRESSION: 1. Negative for pulmonary embolus. 2. Mediastinal and bihilar adenopathy may be reactive in etiology. Consider follow-up CT chest with contrast in 2-3 months, as a lymphoproliferative disorder cannot be excluded. 3. Low-attenuation nodules in the thyroid. Recommend thyroid ultrasound. (Ref: J Am Coll Radiol. 2015  Feb;12(2): 143-50). 4. Mild gastric wall thickening. 5. Sclerotic lesion in the proximal right humerus likely represents a chondroid lesion. In the absence of pain, no further imaging is recommended. 6.  Aortic atherosclerosis (ICD10-I70.0). 7. Enlarged pulmonic trunk, indicative of pulmonary arterial hypertension. Electronically Signed   By: Newell Eke M.D.   On: 09/04/2023 10:14   DG Chest 2 View Result Date: 09/04/2023 EXAM: 2 VIEW(S) XRAY OF THE CHEST 09/04/2023 07:22:00 AM COMPARISON: 04/06/2023 CLINICAL HISTORY: Shortness of breath. FINDINGS: LUNGS AND PLEURA: Small bilateral pleural effusions noted with mild increased interstitial markings. No consolidative change. No pneumothorax. HEART AND MEDIASTINUM: Stable cardiac enlargement. BONES AND SOFT TISSUES: Stable appearance of chronic sclerotic lesion in the proximal right humerus measuring 3.3 cm. No acute osseous findings. IMPRESSION: 1. Small bilateral pleural effusions and mild increased interstitial markings, consistent with pulmonary edema. Findings favor mild CHF. 2. Stable cardiac enlargement. Electronically signed by: Waddell Calk MD 09/04/2023 07:35 AM EDT RP Workstation: [id number]     Procedures   Medications Ordered in the ED  hydrOXYzine  (ATARAX ) tablet 25 mg (25 mg Oral Given 09/04/23 0833)  iohexol  (OMNIPAQUE ) 350 MG/ML injection 75 mL (75 mLs Intravenous Contrast Given 09/04/23 0914)  potassium chloride  SA (KLOR-CON  M) CR tablet 60 mEq (60 mEq Oral Given 09/04/23 1059)                                    Medical Decision Making Amount and/or Complexity of Data Reviewed Labs: ordered. Radiology: ordered.  Risk Prescription drug management.   This patient presents to the ED for concern of shortness of breath, this involves an extensive number of treatment options, and is a complaint that carries with it a high risk of complications and morbidity.  The differential diagnosis includes ACS, PE, pneumonia, CHF,  medication side effect   Co morbidities that complicate the patient evaluation  Hypertension, type 2 diabetes   Lab Tests:  I Ordered, and personally interpreted labs.  The pertinent results include: CBC with leukocytosis 12.6, RBC elevated at 5.16, platelets elevated 415, BMP mild hypokalemia of 3.1, hepatic function panel slight hypoalbuminemia at 3.4, troponin unremarkable at 18, troponin remaining flat at 19, BNP slightly elevated 108.6, D-dimer slightly elevated 0.73   Imaging Studies ordered:  I ordered imaging studies including chest x-ray, CT angio chest I independently visualized and interpreted imaging which showed small bilateral pleural effusions and mild increased interstitial markings, consistent with pulmonary edema. Findings favor mild CHF. 2. Stable cardiac enlargement. Negative for pulmonary embolus. 2. Mediastinal and  bihilar adenopathy may be reactive in etiology. Consider follow-up CT chest with contrast in 2-3 months, as a lymphoproliferative disorder cannot be excluded. 3. Low-attenuation nodules in the thyroid. Recommend thyroid ultrasound. (Ref: J Am Coll Radiol. 2015 Feb;12(2): 143-50). 4. Mild gastric wall thickening. 5. Sclerotic lesion in the proximal right humerus likely represents a chondroid lesion. In the absence of pain, no further imaging is recommended. 6.  Aortic atherosclerosis (ICD10-I70.0). 7. Enlarged pulmonic trunk, indicative of pulmonary arterial hypertension. I agree with the radiologist interpretation   Cardiac Monitoring: / EKG:  The patient was maintained on a cardiac monitor.  I personally viewed and interpreted the cardiac monitored which showed an underlying rhythm of: Sinus tachycardia   Consultations Obtained:  I requested consultation with none,  and discussed lab and imaging findings as well as pertinent plan - they recommend: N/A   Problem List / ED Course / Critical interventions / Medication management  Patient with past  history significant for hypertension and type 2 diabetes presents to the ED with concerns of shortness of breath.  Patient reports ongoing exertional dyspnea for the last 2 weeks.  Recently started on losartan for blood pressure management after reportedly uncontrolled on hydrochlorothiazide .  No prior history of similar side effects right blood pressure medications.  She does report that she did take 1 blood pressure medication at 1 time that made her swell.  Denies any significant feelings of chest pain no reported fever, chills or bodyaches.  No sick contacts. Patient's physical exam reveals borderline tachycardia and tachypnea with some evident increased work of breathing at baseline at rest.  No abnormal heart or lung sounds.  No appreciable lower extremity swelling or edema.  Normal bowel sounds.  Patient mentation at baseline.  Based on presentation, side effect to losartan is possible with the dyspnea that she is reporting but will rule out ACS with troponin, D-dimer for PE assessment, and BNP for CHF evaluation.  Other basic labs obtained. CBC shows leukocytosis), elevation as well as with platelet elevation indicating possible hemoconcentration, BMP with mild hypokalemia 3.1, troponin elevated at 18 with repeat flat at 19, BNP slightly elevated 108.6, D-dimer elevated 0.73.  Will pursue CT imaging to rule out PE.  Chest x-ray with small bilateral pleural effusions. CT angio chest is negative for PE. Some nonspecific findings including concerns for hilar adenopathy as well as findings concerning for possible pulmonary hypertension. I suspect patient's symptoms are most likely secondary to discontinuation of hydrochlorothiazide  after being switched to Losartan. Not a clear case of CHF in this patient. I advised her to restart hydrochlorothiazide  for the next 3 days and follow up closely with PCP. BP not considerably elevated. Return precautions discussed such as concerns for new or worsening symptoms.  Otherwise stable at this time for outpatient follow up and discharged home. I ordered medication including Atarax , Klor-Con   for anxiety, hypokalemia  Reevaluation of the patient after these medicines showed that the patient improved I have reviewed the patients home medicines and have made adjustments as needed   Social Determinants of Health:  None   Test / Admission - Considered:  Considered admission but stable for outpatient follow up.   Final diagnoses:  Shortness of breath  Elevated brain natriuretic peptide (BNP) level  Elevated troponin  Fluid retention  Hypokalemia    ED Discharge Orders     None          Lynn Legrand LABOR, PA-C 09/05/23 9286    Lynn Ozell LABOR, DO 09/13/23  0703  

## 2023-09-04 NOTE — Discharge Instructions (Addendum)
 You were seen in the ER today for concerns of shortness of breath. Thankfully your labs, imaging, and EKG show that you are not having a heart attack, pneumonia, blood clot in the lungs, or obvious heart failure, but you do appear to be holding on to a slight amount of fluid likely causing some of the increased trouble breathing. I would recommend switching back to your hydrochlorothiazide  for the next 3 days and then follow up with your primary care provider regarding your blood pressure management as you may require to stay on a diuretic for longer term. Return to the ER for concerns of new or worsening symptoms.

## 2023-11-09 ENCOUNTER — Emergency Department (HOSPITAL_BASED_OUTPATIENT_CLINIC_OR_DEPARTMENT_OTHER)
Admission: EM | Admit: 2023-11-09 | Discharge: 2023-11-09 | Disposition: A | Discharge status: Home / Self Care | Attending: Emergency Medicine | Admitting: Emergency Medicine

## 2023-11-09 ENCOUNTER — Other Ambulatory Visit: Payer: Self-pay

## 2023-11-09 DIAGNOSIS — Z794 Long term (current) use of insulin: Secondary | ICD-10-CM | POA: Diagnosis not present

## 2023-11-09 DIAGNOSIS — F17210 Nicotine dependence, cigarettes, uncomplicated: Secondary | ICD-10-CM | POA: Diagnosis not present

## 2023-11-09 DIAGNOSIS — R051 Acute cough: Secondary | ICD-10-CM

## 2023-11-09 DIAGNOSIS — Z79899 Other long term (current) drug therapy: Secondary | ICD-10-CM | POA: Insufficient documentation

## 2023-11-09 DIAGNOSIS — E119 Type 2 diabetes mellitus without complications: Secondary | ICD-10-CM | POA: Insufficient documentation

## 2023-11-09 DIAGNOSIS — B349 Viral infection, unspecified: Secondary | ICD-10-CM | POA: Insufficient documentation

## 2023-11-09 DIAGNOSIS — Z7984 Long term (current) use of oral hypoglycemic drugs: Secondary | ICD-10-CM | POA: Insufficient documentation

## 2023-11-09 DIAGNOSIS — R059 Cough, unspecified: Secondary | ICD-10-CM | POA: Diagnosis present

## 2023-11-09 DIAGNOSIS — I10 Essential (primary) hypertension: Secondary | ICD-10-CM | POA: Insufficient documentation

## 2023-11-09 LAB — RESP PANEL BY RT-PCR (RSV, FLU A&B, COVID)  RVPGX2
Influenza A by PCR: NEGATIVE
Influenza B by PCR: NEGATIVE
Resp Syncytial Virus by PCR: NEGATIVE
SARS Coronavirus 2 by RT PCR: NEGATIVE

## 2023-11-09 NOTE — ED Provider Notes (Signed)
 [place] EMERGENCY DEPARTMENT AT [facility] Provider Note   CSN: [id number] Arrival date & time: 11/09/23  1254     Patient presents with: Fever   Lynn Compton is a 58 y.o. female.   58 y.o female with a PMH of HTN, DM presents to the ED with a chief complaint of cough, sore throat x 3 days. Patient is currently works in [employer], reports that she has been feeling sick for the past 3 days, has taken some over-the-counter medication without much improvement in symptoms.  Describes her cough as dry.  She does have a history of tobacco use, smokes cigarettes daily.  Also reports a fever of 101 at home, did arrive to the emergency department afebrile.  She has been taking some Tylenol  along with ibuprofen  to help with the fever.  She also endorses some bodyaches.  No chest pain, no shortness of breath, no prior history of bronchitis.  The history is provided by the patient.  Fever Associated symptoms: cough and sore throat   Associated symptoms: no chest pain        Prior to Admission medications   Medication Sig Start Date End Date Taking? Authorizing Provider  erythromycin  ophthalmic ointment Place a 1/2 inch ribbon of ointment into the lower eyelid nightly for 5 nights Patient not taking: Reported on 09/04/2023 08/01/23   Rising, Asberry, PA-C  glimepiride (AMARYL) 4 MG tablet Take 4 mg by mouth daily with breakfast. 09/06/20   [provider]  hydrochlorothiazide  (HYDRODIURIL ) 25 MG tablet Take 1 tablet (25 mg total) by mouth daily. Patient not taking: Reported on 09/04/2023 02/24/16   Joy, Elouise C, PA-C  hydrOXYzine  (ATARAX ) 25 MG tablet Take 25 mg by mouth at bedtime as needed.    [provider]  ipratropium-albuterol  (DUONEB) 0.5-2.5 (3) MG/3ML SOLN Take 3 mLs by nebulization every 6 (six) hours as needed. 04/06/23   Raspet, Erin K, PA-C  losartan (COZAAR) 25 MG tablet Take 25 mg by mouth daily. 09/01/23   [provider]  metFORMIN  (GLUCOPHAGE) 1000 MG tablet Take 1,000 mg by mouth 2 (two) times daily with a meal.    [provider]  OZEMPIC, 0.25 OR 0.5 MG/DOSE, 2 MG/3ML SOPN Inject 0.5 mg into the skin once a week.    [provider]  tretinoin (RETIN-A) 0.05 % cream Apply topically at bedtime. 02/22/22   [provider]  Vitamin D , Ergocalciferol , (DRISDOL) 50000 units CAPS capsule Take 50,000 Units by mouth once a week. 11/26/15   [provider]    Allergies: Patient has no known allergies.    Review of Systems  Constitutional:  Positive for fever.  HENT:  Positive for sore throat.   Respiratory:  Positive for cough. Negative for shortness of breath.   Cardiovascular:  Negative for chest pain.    Updated Vital Signs BP 129/65   Pulse 95   Temp 99.9 F (37.7 C) (Oral)   Resp 18   SpO2 98%   Physical Exam Vitals and nursing note reviewed.  Constitutional:      Appearance: Normal appearance.  HENT:     Head: Normocephalic and atraumatic.     Nose: Congestion present.     Mouth/Throat:     Pharynx: Posterior oropharyngeal erythema present.     Comments: Oropharynx is clear, mild erythema noted.  No tonsillar exudate or PTA noted. Eyes:     Pupils: Pupils are equal, round, and reactive to light.  Cardiovascular:  Rate and Rhythm: Normal rate.     Comments: No pitting edema, no calf tenderness bilaterally. Pulmonary:     Effort: Pulmonary effort is normal.     Breath sounds: No wheezing.     Comments: No wheezing, rhonchi or rales. Abdominal:     General: Abdomen is flat.  Musculoskeletal:     Cervical back: Normal range of motion and neck supple.  Skin:    General: Skin is warm and dry.  Neurological:     Mental Status: She is alert and oriented to person, place, and time.     (all labs ordered are listed, but only abnormal results are displayed) Labs Reviewed  RESP PANEL BY RT-PCR (RSV, FLU A&B, COVID)  RVPGX2    EKG: None  Radiology: No results  found.   Procedures   Medications Ordered in the ED - No data to display                                  Medical Decision Making    Patient presented to the ED with a complaint of sore throat, cough, body aches which have been ongoing for the past couple of days, complaining of a dry cough.  Has been taking over-the-counter medication without any improvement.  She is a smoker, does have some mild diminished breath sounds on exam.  Vitals are within normal limits, no hypoxia, no tachycardia.  Mild temperature recorded, we discussed likely viral illness.  Negative COVID-19, RSV, influenza.  Progress discussed at length.  Patient hemodynamically stable for discharge.    Portions of this note were generated with Scientist, clinical (histocompatibility and immunogenetics). Dictation errors may occur despite best attempts at proofreading.   Final diagnoses:  Acute cough  Viral illness    ED Discharge Orders     None          Maureen Broad, PA-C 11/09/23 1557    Tegeler, Lonni PARAS, MD 11/09/23 2322

## 2023-11-09 NOTE — Discharge Instructions (Signed)
 Your laboratory result was negative for COVID-19, RSV, influenza.  Please continue to hydrate with plenty of fluids.  You may purchase some over-the-counter Robitussin to help with your cough.

## 2023-11-09 NOTE — ED Triage Notes (Signed)
 Pt caox4 ambulatory c/o cough, congestion, headache, bodyaches x2 days, 101 F temp today.

## 2024-02-01 ENCOUNTER — Emergency Department (HOSPITAL_BASED_OUTPATIENT_CLINIC_OR_DEPARTMENT_OTHER)
Admission: EM | Admit: 2024-02-01 | Discharge: 2024-02-01 | Disposition: A | Discharge status: Home / Self Care | Attending: Emergency Medicine | Admitting: Emergency Medicine

## 2024-02-01 ENCOUNTER — Encounter (HOSPITAL_BASED_OUTPATIENT_CLINIC_OR_DEPARTMENT_OTHER): Payer: Self-pay | Admitting: Emergency Medicine

## 2024-02-01 ENCOUNTER — Other Ambulatory Visit: Payer: Self-pay

## 2024-02-01 DIAGNOSIS — Z7984 Long term (current) use of oral hypoglycemic drugs: Secondary | ICD-10-CM | POA: Insufficient documentation

## 2024-02-01 DIAGNOSIS — M791 Myalgia, unspecified site: Secondary | ICD-10-CM | POA: Insufficient documentation

## 2024-02-01 DIAGNOSIS — I10 Essential (primary) hypertension: Secondary | ICD-10-CM | POA: Insufficient documentation

## 2024-02-01 DIAGNOSIS — R509 Fever, unspecified: Secondary | ICD-10-CM | POA: Insufficient documentation

## 2024-02-01 DIAGNOSIS — E119 Type 2 diabetes mellitus without complications: Secondary | ICD-10-CM | POA: Insufficient documentation

## 2024-02-01 DIAGNOSIS — Z79899 Other long term (current) drug therapy: Secondary | ICD-10-CM | POA: Insufficient documentation

## 2024-02-01 DIAGNOSIS — J029 Acute pharyngitis, unspecified: Secondary | ICD-10-CM | POA: Insufficient documentation

## 2024-02-01 LAB — RESP PANEL BY RT-PCR (RSV, FLU A&B, COVID)  RVPGX2
Influenza A by PCR: NEGATIVE
Influenza B by PCR: NEGATIVE
Resp Syncytial Virus by PCR: NEGATIVE
SARS Coronavirus 2 by RT PCR: NEGATIVE

## 2024-02-01 LAB — GROUP A STREP BY PCR: Group A Strep by PCR: NOT DETECTED

## 2024-02-01 MED ORDER — ACETAMINOPHEN 500 MG PO TABS
1000.0000 mg | ORAL_TABLET | Freq: Once | ORAL | Status: AC
  Administered 2024-02-01: 1000 mg via ORAL
  Filled 2024-02-01: qty 2

## 2024-02-01 MED ORDER — DEXAMETHASONE 4 MG PO TABS
6.0000 mg | ORAL_TABLET | Freq: Once | ORAL | Status: AC
  Administered 2024-02-01: 6 mg via ORAL
  Filled 2024-02-01: qty 2

## 2024-02-01 NOTE — Discharge Instructions (Signed)
 You tested negative for COVID/flu/RSV/strep throat while in the emergency department today.  Your symptoms are likely due to a viral illness, continue ibuprofen /Tylenol  as needed for fever or bodyaches, continue to push fluids to avoid dehydration.  Return to the emergency department if your symptoms worsen.

## 2024-02-01 NOTE — ED Notes (Signed)
 Reviewed AVS/discharge instruction with patient. Time allotted for and all questions answered. Patient is agreeable for d/c and escorted to ed exit by staff.

## 2024-02-01 NOTE — ED Triage Notes (Signed)
 Reports intermittent fever with sore throat and body aches since Tuesday. Denies SHOB or CP.

## 2024-02-01 NOTE — ED Provider Notes (Signed)
 " [facility] Provider Note   CSN: [id number] Arrival date & time: 02/01/24  1151     Patient presents with: Sore Throat   Lynn Compton is a 59 y.o. female.   59 year old female presenting with bodyaches/fever/sore throat.  Symptoms began on Tuesday with bodyaches and a fever with Tmax of 101F, patient took Tylenol  which did help relieve her symptoms however today she noted a worsening sore throat.  Denies cough, congestion, chest pain, shortness of breath, abdominal pain, nausea/vomiting/diarrhea.  No known sick contacts, patient reports that her grandchildren live with her and went back to school this week but none of them have been ill at home.   Sore Throat       Prior to Admission medications  Medication Sig Start Date End Date Taking? Authorizing Provider  erythromycin  ophthalmic ointment Place a 1/2 inch ribbon of ointment into the lower eyelid nightly for 5 nights Patient not taking: Reported on 09/04/2023 08/01/23   Rising, [place], PA-C  glimepiride (AMARYL) 4 MG tablet Take 4 mg by mouth daily with breakfast. 09/06/20   [provider]  hydrochlorothiazide  (HYDRODIURIL ) 25 MG tablet Take 1 tablet (25 mg total) by mouth daily. Patient not taking: Reported on 09/04/2023 02/24/16   Zada Elouise BROCKS, PA-C  hydrOXYzine  (ATARAX ) 25 MG tablet Take 25 mg by mouth at bedtime as needed.    [provider]  ipratropium-albuterol  (DUONEB) 0.5-2.5 (3) MG/3ML SOLN Take 3 mLs by nebulization every 6 (six) hours as needed. 04/06/23   Raspet, Vidhi Delellis K, PA-C  losartan (COZAAR) 25 MG tablet Take 25 mg by mouth daily. 09/01/23   [provider]  metFORMIN (GLUCOPHAGE) 1000 MG tablet Take 1,000 mg by mouth 2 (two) times daily with a meal.    [provider]  OZEMPIC, 0.25 OR 0.5 MG/DOSE, 2 MG/3ML SOPN Inject 0.5 mg into the skin once a week.    [provider]  tretinoin (RETIN-A) 0.05 % cream Apply topically at  bedtime. 02/22/22   [provider]  Vitamin D , Ergocalciferol , (DRISDOL) 50000 units CAPS capsule Take 50,000 Units by mouth once a week. 11/26/15   [provider]    Allergies: Patient has no known allergies.    Review of Systems  Updated Vital Signs  Vitals:   02/01/24 1155 02/01/24 1320  BP: 131/67 134/82  Pulse: (!) 102 89  Resp: 17 16  Temp: 98.2 F (36.8 C)   SpO2: 97% 98%     Physical Exam Vitals and nursing note reviewed.  Constitutional:      General: She is not in acute distress.    Appearance: Normal appearance.  HENT:     Head: Normocephalic and atraumatic.     Mouth/Throat:     Mouth: Mucous membranes are moist.     Pharynx: Oropharynx is clear. Uvula midline.     Tonsils: No tonsillar exudate or tonsillar abscesses.     Comments: Mildly edematous tonsils, no unilateral swelling or fluctuance suggestive of peritonsillar abscess, normal phonation, no sublingual edema/elevation, managing secretions Eyes:     Extraocular Movements: Extraocular movements intact.     Pupils: Pupils are equal, round, and reactive to light.  Cardiovascular:     Rate and Rhythm: Normal rate and regular rhythm.     Heart sounds: Normal heart sounds.  Pulmonary:     Effort: Pulmonary effort is normal. No respiratory distress.     Breath sounds: Normal breath sounds.  Musculoskeletal:  Cervical back: Normal range of motion and neck supple.     Right lower leg: No edema.     Left lower leg: No edema.     Comments: Moves all extremities spontaneously without difficulty  Lymphadenopathy:     Cervical: No cervical adenopathy.  Skin:    General: Skin is warm and dry.  Neurological:     General: No focal deficit present.     Mental Status: She is alert and oriented to person, place, and time.     (all labs ordered are listed, but only abnormal results are displayed) Labs Reviewed  RESP PANEL BY RT-PCR (RSV, FLU A&B, COVID)  [id number]  GROUP A STREP BY PCR     EKG: None  Radiology: No results found.   Procedures   Medications Ordered in the ED  dexamethasone  (DECADRON ) tablet 6 mg (has no administration in time range)  acetaminophen  (TYLENOL ) tablet 1,000 mg (1,000 mg Oral Given 02/01/24 1237)                                    Medical Decision Making This patient presents to the ED for concern of sore throat and bodyaches, this involves an extensive number of treatment options, and is a complaint that carries with it a high risk of complications and morbidity.  The differential diagnosis includes COVID/flu/RSV, other viral respiratory illness, strep pharyngitis, other viral pharyngitis, peritonsillar abscess/retropharyngeal abscess   Co morbidities that complicate the patient evaluation  Diabetes, hypertension   Additional history obtained:  Additional history obtained from record review External records from outside source obtained and reviewed including prior ED note   Lab Tests:  I Ordered, and personally interpreted labs.  The pertinent results include: COVID/flu/RSV negative.  Strep PCR negative.    Cardiac Monitoring: / EKG:  The patient was maintained on a cardiac monitor.  I personally viewed and interpreted the cardiac monitored which showed an underlying rhythm of: NSR   Problem List / ED Course / Critical interventions / Medication management  I ordered medication including Tylenol  for body aches, Decadron  for sore throat Reevaluation of the patient after these medicines showed that the patient improved I have reviewed the patients home medicines and have made adjustments as needed   Social Determinants of Health:  Tobacco use   Test / Admission - Considered:  Physical exam is notable as above, patient does have mildly edematous tonsils bilaterally, no exudate/appreciable erythema.  Normal phonation and managing secretions, no concern for peritonsillar abscess or retropharyngeal abscess at this time.   Patient is able to tolerate p.o. and is drinking water.  She is well-appearing and in no acute distress, she is afebrile. COVID/flu/RSV and strep PCR are negative as above.  I suspect that patient's symptoms today are secondary to a viral illness, will administer Decadron  to alleviate sore throat/tonsillar swelling.  I recommend that she continue ibuprofen /Tylenol  as needed for body aches and if her fever returns.  Patient voiced understanding and is in agreement with this plan.  Return precautions discussed, she is appropriate for discharge at this time.    Risk OTC drugs. Prescription drug management.        Final diagnoses:  Sore throat    ED Discharge Orders     None          Glendia Rocky SAILOR, [place] 02/01/24 1336    Cottie Donnice PARAS, MD 02/01/24 1351  "
